# Patient Record
Sex: Female | Born: 1990 | Race: Black or African American | Hispanic: No | Marital: Single
Health system: Southern US, Community
[De-identification: ages and names within clinical notes are randomized; demographics above are authoritative.]

## PROBLEM LIST (undated history)

## (undated) DIAGNOSIS — S62009A Unspecified fracture of navicular [scaphoid] bone of unspecified wrist, initial encounter for closed fracture: Secondary | ICD-10-CM

## (undated) DIAGNOSIS — S43004A Unspecified dislocation of right shoulder joint, initial encounter: Secondary | ICD-10-CM

## (undated) DIAGNOSIS — N83201 Unspecified ovarian cyst, right side: Secondary | ICD-10-CM

## (undated) DIAGNOSIS — D649 Anemia, unspecified: Secondary | ICD-10-CM

## (undated) DIAGNOSIS — N83202 Unspecified ovarian cyst, left side: Secondary | ICD-10-CM

---
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
## 2012-06-01 ENCOUNTER — Encounter (HOSPITAL_COMMUNITY): Payer: Self-pay | Admitting: Physical Medicine and Rehabilitation

## 2012-06-01 ENCOUNTER — Emergency Department (HOSPITAL_COMMUNITY)
Admission: EM | Admit: 2012-06-01 | Discharge: 2012-06-01 | Disposition: A | Discharge status: Home / Self Care | Payer: Medicaid Other | Attending: Emergency Medicine | Admitting: Emergency Medicine

## 2012-06-01 DIAGNOSIS — W108XXA Fall (on) (from) other stairs and steps, initial encounter: Secondary | ICD-10-CM | POA: Insufficient documentation

## 2012-06-01 DIAGNOSIS — W19XXXA Unspecified fall, initial encounter: Secondary | ICD-10-CM

## 2012-06-01 DIAGNOSIS — S0003XA Contusion of scalp, initial encounter: Secondary | ICD-10-CM | POA: Insufficient documentation

## 2012-06-01 DIAGNOSIS — S1093XA Contusion of unspecified part of neck, initial encounter: Secondary | ICD-10-CM | POA: Insufficient documentation

## 2012-06-01 DIAGNOSIS — Y9229 Other specified public building as the place of occurrence of the external cause: Secondary | ICD-10-CM | POA: Insufficient documentation

## 2012-06-01 DIAGNOSIS — S01501A Unspecified open wound of lip, initial encounter: Secondary | ICD-10-CM | POA: Insufficient documentation

## 2012-06-01 DIAGNOSIS — S01511A Laceration without foreign body of lip, initial encounter: Secondary | ICD-10-CM

## 2012-06-01 DIAGNOSIS — F17200 Nicotine dependence, unspecified, uncomplicated: Secondary | ICD-10-CM | POA: Insufficient documentation

## 2012-06-01 DIAGNOSIS — Y9389 Activity, other specified: Secondary | ICD-10-CM | POA: Insufficient documentation

## 2012-06-01 DIAGNOSIS — S0083XA Contusion of other part of head, initial encounter: Secondary | ICD-10-CM

## 2012-06-01 MED ORDER — IBUPROFEN 600 MG PO TABS: 600.0000 mg | ORAL_TABLET | Freq: Four times a day (QID) | ORAL | Status: DC | PRN

## 2012-06-01 NOTE — ED Notes (Signed)
Pt presents to department for evaluation of fall. States she was at party last night when she fell down stairs accidentally. Pt states pain to nose and lips. Abrasion noted to top of lip, no bleeding noted. Pt also states pain to bridge of nose. 8/10 at the time. Pt is conscious alert and oriented x4. No signs of acute distress noted.

## 2012-06-01 NOTE — ED Notes (Signed)
Discharge instructions reviewed. Pt verbalized understanding.  

## 2012-06-01 NOTE — ED Provider Notes (Signed)
History     CSN: [id number]  Arrival date & time 06/01/12  1816   First MD Initiated Contact with Patient 06/01/12 1959      Chief Complaint  Patient presents with  . Fall    (Consider location/radiation/quality/duration/timing/severity/associated sxs/prior treatment) HPI Comments: Patient presents after a fall down several stairs approx 19 hrs ago. Patient c/o laceration to upper lip, swelling over bridge of nose. Also c/o mild R knee pain but is walking and moving without difficulty. No LOC, N/V, blurry vision, HA. No treatments PTA. Denies other injuries. Onset acute. Course constant. Nothing makes symptoms better or worse.   Patient is a 21 y.o. female presenting with fall. The history is provided by the patient.  Fall Pertinent negatives include no numbness, no nausea, no vomiting and no headaches.    No past medical history on file.  No past surgical history on file.  No family history on file.  History  Substance Use Topics  . Smoking status: Current Every Day Smoker    Types: Cigarettes  . Smokeless tobacco: Not on file  . Alcohol Use: No    OB History    Grav Para Term Preterm Abortions TAB SAB Ect Mult Living                  Review of Systems  Constitutional: Negative for fatigue.  HENT: Negative for neck pain and tinnitus.   Eyes: Negative for photophobia, pain and visual disturbance.  Respiratory: Negative for shortness of breath.   Cardiovascular: Negative for chest pain.  Gastrointestinal: Negative for nausea and vomiting.  Musculoskeletal: Negative for back pain and gait problem.  Skin: Positive for wound.  Neurological: Negative for dizziness, weakness, light-headedness, numbness and headaches.  Psychiatric/Behavioral: Negative for confusion and decreased concentration.    Allergies  Review of patient's allergies indicates no known allergies.  Home Medications   Current Outpatient Rx  Name Route Sig Dispense Refill  . ACETAMINOPHEN 500  MG PO TABS Oral Take 1,000 mg by mouth every 6 (six) hours as needed. For pain    . IBUPROFEN 200 MG PO TABS Oral Take 400 mg by mouth every 6 (six) hours as needed. For pain      BP 111/68  Pulse 83  Temp 98.7 F (37.1 C) (Oral)  Resp 18  SpO2 100%  Physical Exam  Nursing note and vitals reviewed. Constitutional: She is oriented to person, place, and time. She appears well-developed and well-nourished.  HENT:  Head: Normocephalic. Head is without raccoon's eyes and without Battle's sign.  Right Ear: Tympanic membrane, external ear and ear canal normal. No hemotympanum.  Left Ear: Tympanic membrane, external ear and ear canal normal. No hemotympanum.  Nose: Nose normal. No nasal septal hematoma.  Mouth/Throat: Uvula is midline, oropharynx is clear and moist and mucous membranes are normal.    Eyes: Conjunctivae normal, EOM and lids are normal. Pupils are equal, round, and reactive to light. Right eye exhibits no nystagmus. Left eye exhibits no nystagmus.       No visible hyphema noted  Neck: Normal range of motion. Neck supple.  Cardiovascular: Normal rate and regular rhythm.   Pulmonary/Chest: Effort normal and breath sounds normal.  Abdominal: Soft. There is no tenderness.  Musculoskeletal:       Cervical back: She exhibits normal range of motion, no tenderness and no bony tenderness.       Thoracic back: She exhibits no tenderness and no bony tenderness.  Lumbar back: She exhibits no tenderness and no bony tenderness.  Neurological: She is alert and oriented to person, place, and time. She has normal strength and normal reflexes. No cranial nerve deficit or sensory deficit. Coordination normal. GCS eye subscore is 4. GCS verbal subscore is 5. GCS motor subscore is 6.  Skin: Skin is warm and dry.  Psychiatric: She has a normal mood and affect.    ED Course  Procedures (including critical care time)  Labs Reviewed - No data to display No results found.   1. Fall     2. Lip laceration   3. Facial contusion       8:08 PM Patient seen and examined. Patient counseled on wound care.    Vital signs reviewed and are as follows: Filed Vitals:   06/01/12 1840  BP: 111/68  Pulse: 83  Temp: 98.7 F (37.1 C)  Resp: 18   Patient was counseled on head injury precautions and symptoms that should indicate their return to the ED.  These include severe worsening headache, vision changes, confusion, loss of consciousness, trouble walking, nausea & vomiting, or weakness/tingling in extremities.     MDM  Pt 19-20hrs s/p head injury. Contusion on bridge of nose. No point tenderness of face. Normal jaw occlusion. Full ROM jaw. Lac is healing, non-gaping, no indication for closure. No other injuries. Patient appears well.         Renne Crigler, [place] 06/03/12 1649

## 2012-06-04 NOTE — ED Provider Notes (Signed)
Medical screening examination/treatment/procedure(s) were performed by non-physician practitioner and as supervising physician I was immediately available for consultation/collaboration.  Jaryan Chicoine K Linker, MD 06/04/12 1203 

## 2013-12-21 ENCOUNTER — Encounter (HOSPITAL_COMMUNITY): Payer: Self-pay | Admitting: Emergency Medicine

## 2013-12-21 ENCOUNTER — Emergency Department (HOSPITAL_COMMUNITY)
Admission: EM | Admit: 2013-12-21 | Discharge: 2013-12-21 | Discharge status: Against Medical Advice | Payer: Medicaid Other | Attending: Emergency Medicine | Admitting: Emergency Medicine

## 2013-12-21 ENCOUNTER — Emergency Department (HOSPITAL_COMMUNITY): Payer: Medicaid Other

## 2013-12-21 ENCOUNTER — Emergency Department (INDEPENDENT_AMBULATORY_CARE_PROVIDER_SITE_OTHER)
Admission: EM | Admit: 2013-12-21 | Discharge: 2013-12-21 | Disposition: A | Discharge status: Home / Self Care | Payer: Medicaid Other | Source: Home / Self Care | Attending: Family Medicine | Admitting: Family Medicine

## 2013-12-21 DIAGNOSIS — F17200 Nicotine dependence, unspecified, uncomplicated: Secondary | ICD-10-CM | POA: Insufficient documentation

## 2013-12-21 DIAGNOSIS — Y9351 Activity, roller skating (inline) and skateboarding: Secondary | ICD-10-CM | POA: Insufficient documentation

## 2013-12-21 DIAGNOSIS — Y92838 Other recreation area as the place of occurrence of the external cause: Secondary | ICD-10-CM

## 2013-12-21 DIAGNOSIS — S62009A Unspecified fracture of navicular [scaphoid] bone of unspecified wrist, initial encounter for closed fracture: Secondary | ICD-10-CM

## 2013-12-21 DIAGNOSIS — S6990XA Unspecified injury of unspecified wrist, hand and finger(s), initial encounter: Secondary | ICD-10-CM | POA: Insufficient documentation

## 2013-12-21 DIAGNOSIS — S59919A Unspecified injury of unspecified forearm, initial encounter: Principal | ICD-10-CM

## 2013-12-21 DIAGNOSIS — Y9239 Other specified sports and athletic area as the place of occurrence of the external cause: Secondary | ICD-10-CM | POA: Insufficient documentation

## 2013-12-21 DIAGNOSIS — S59909A Unspecified injury of unspecified elbow, initial encounter: Secondary | ICD-10-CM | POA: Insufficient documentation

## 2013-12-21 MED ORDER — IBUPROFEN 800 MG PO TABS
800.0000 mg | ORAL_TABLET | Freq: Once | ORAL | Status: AC
  Administered 2013-12-21: 800 mg via ORAL

## 2013-12-21 MED ORDER — IBUPROFEN 800 MG PO TABS
ORAL_TABLET | ORAL | Status: AC
  Filled 2013-12-21: qty 1

## 2013-12-21 MED ORDER — HYDROCODONE-ACETAMINOPHEN 5-325 MG PO TABS
ORAL_TABLET | ORAL | Status: AC
  Filled 2013-12-21: qty 1

## 2013-12-21 MED ORDER — HYDROCODONE-ACETAMINOPHEN 5-325 MG PO TABS
1.0000 | ORAL_TABLET | Freq: Once | ORAL | Status: AC
  Administered 2013-12-21: 1 via ORAL

## 2013-12-21 NOTE — ED Provider Notes (Signed)
Whitney Carter is a 23 y.o. female who presents to Urgent Care today for wrist pain. Patient fell skateboarding this morning at about 3:00. She landed on her outstretched right wrist. She notes wrist and forearm pain. She initially presented she was a long emergency department but left after an elbow x-ray was obtained.  She took some Tylenol at about 4:00 in the morning. The pain is severe and worse with activity. She denies any radiating pain weakness or numbness.   History reviewed. No pertinent past medical history. History  Substance Use Topics  . Smoking status: Current Every Day Smoker    Types: Cigarettes  . Smokeless tobacco: Not on file  . Alcohol Use: No   ROS as above Medications: No current facility-administered medications for this encounter.   No current outpatient prescriptions on file.    Exam:  BP 120/81  Pulse 85  Temp(Src) 98.3 F (36.8 C) (Oral)  Resp 16  SpO2 100%  LMP 12/05/2013 Gen: Well NAD Right arm: Swelling over the distal radius. Tender palpation mid shaft radius all metacarpals and distal radius. Tender palpation anatomical snuffbox as well.  Elbow and shoulder motion are intact and nontender.  Pulses capillary refill sensation are intact distally Finger motion is intact.  No results found for this or any previous visit (from the past 24 hour(s)). Dg Elbow Complete Right  12/21/2013   CLINICAL DATA:  Right elbow pain following a fall.  EXAM: RIGHT ELBOW - COMPLETE 3+ VIEW  COMPARISON:  None.  FINDINGS: There is no evidence of fracture, dislocation, or joint effusion. There is no evidence of arthropathy or other focal bone abnormality. Soft tissues are unremarkable.  IMPRESSION: Normal examination.   Electronically Signed   By: Gordan PaymentSteve  Reid M.D.   On: 12/21/2013 05:39   Dg Forearm Left  12/21/2013   CLINICAL DATA:  Pain in the forearm following skateboard accident.  EXAM: LEFT FOREARM - 2 VIEW  COMPARISON:  None.  FINDINGS: There is no evidence of  fracture or other focal bone lesions. Soft tissues are unremarkable.  IMPRESSION: Negative.   Electronically Signed   By: Sherian ReinWei-Chen  Lin M.D.   On: 12/21/2013 17:00   Dg Wrist Complete Left  12/21/2013   CLINICAL DATA:  Skateboard injury.  Fall.  EXAM: LEFT WRIST - COMPLETE 3+ VIEW  COMPARISON:  None.  FINDINGS: Transverse acute fracture of the midportion of the scaphoid. No other fracture identified. Regional soft tissue swelling noted.  IMPRESSION: 1. Transverse acute fracture of the midportion of the scaphoid.   Electronically Signed   By: Herbie BaltimoreWalt  Liebkemann M.D.   On: 12/21/2013 16:59    Assessment and Plan: 23 y.o. female with right scaphoid fracture.  Patient was placed into a thumb spica splint.  Followup with hand surgery in a few days. NSAIDs for pain control.    Discussed warning signs or symptoms. Please see discharge instructions. Patient expresses understanding.  **Note** the order was originally placed for the left wrist. The read says left wrist and left forearm however the injury is on the right side  Rodolph BongEvan S Vincient Vanaman, MD 12/21/13 [phone]

## 2013-12-21 NOTE — ED Notes (Signed)
Pt reports that she was riding on a skateboard at 0400 this morning and was going down a hill fast and fell hurting her right arm. Pt a&o x4, skin warm and dry, ambulatory to triage.

## 2013-12-21 NOTE — Discharge Instructions (Signed)
Thank you for coming in today. Take up to 2 aleve twice daily for pain as needed.  Follow up with Dr. Merrilee SeashoreKuzma's office this week.   Scaphoid Fracture, Wrist A fracture is a break in the bone. The bone you have broken often does not show up as a fracture on x-ray until later on in the healing phase. This bone is called the scaphoid bone. With this bone, your caregiver will often cast or splint your wrist as though it is fractured, even if a fracture is not seen on the x-ray. This is often done with wrist injuries in which there is tenderness at the base of the thumb. An x-ray at 1-3 weeks after your injury may confirm this fracture. A cast or splint is used to protect and keep your injured bone in good position for healing. The cast or splint will be on generally for about 6 to 16 weeks, depending on your health, age, the fracture location and how quickly you heal. Another name for the scaphoid bone is the navicular bone. HOME CARE INSTRUCTIONS   To lessen the swelling and pain, keep the injured part elevated above your heart while sitting or lying down.  Apply ice to the injury for 15-20 minutes, 03-04 times per day while awake, for 2 days. Put the ice in a plastic bag and place a thin towel between the bag of ice and your cast.  If you have a plaster or fiberglass cast or splint:  Do not try to scratch the skin under the cast using sharp or pointed objects.  Check the skin around the cast every day. You may put lotion on any red or sore areas.  Keep your cast or splint dry and clean.  If you have a plaster splint:  Wear the splint as directed.  You may loosen the elastic bandage around the splint if your fingers become numb, tingle, or turn cold or blue.  If you have been put in a removable splint, wear and use as directed.  Do not put pressure on any part of your cast or splint; it may deform or break. Rest your cast or splint only on a pillow the first 24 hours until it is fully  hardened.  Your cast or splint can be protected during bathing with a plastic bag. Do not lower the cast or splint into water.  Only take over-the-counter or prescription medicines for pain, discomfort, or fever as directed by your caregiver.  If your caregiver has given you a follow up appointment, it is very important to keep that appointment. Not keeping the appointment could result in chronic pain and decreased function. If there is any problem keeping the appointment, you must call back to this facility for assistance. SEEK IMMEDIATE MEDICAL CARE IF:   Your cast gets damaged, wet or breaks.  You have continued severe pain or more swelling than you did before the cast or splint was put on.  Your skin or nails below the injury turn blue or gray, or feel cold or numb.  You have tingling or burning pain in your fingers or increasing pain with movement of your fingers Document Released: 07/14/2002 Document Revised: 10/16/2011 Document Reviewed: 03/12/2009 [facility] Patient Information 2014 [employer], [place].

## 2013-12-21 NOTE — Progress Notes (Signed)
Orthopedic Tech Progress Note Patient Details:  Whitney AngerJazmine S Carter Oct 25, 1990 [id number]  Ortho Devices Type of Ortho Device: Ace wrap;Thumb spica splint Splint Material: Fiberglass Ortho Device/Splint Location: RUE Ortho Device/Splint Interventions: Ordered;Application   Jennye MoccasinAnthony Craig Emersynn Deatley 12/21/2013, 5:17 PM

## 2013-12-21 NOTE — ED Notes (Signed)
Patient complains of right wrist pain after falling off of her skate board last night around 3 am.

## 2013-12-21 NOTE — ED Notes (Signed)
Pt was not in room with PA went to assess pt

## 2013-12-22 ENCOUNTER — Ambulatory Visit (HOSPITAL_COMMUNITY)
Admission: RE | Admit: 2013-12-22 | Discharge: 2013-12-22 | Disposition: A | Discharge status: Home / Self Care | Payer: Medicaid Other | Source: Ambulatory Visit | Attending: Family Medicine | Admitting: Family Medicine

## 2013-12-22 ENCOUNTER — Other Ambulatory Visit: Payer: Self-pay | Admitting: Family Medicine

## 2013-12-22 DIAGNOSIS — S62009A Unspecified fracture of navicular [scaphoid] bone of unspecified wrist, initial encounter for closed fracture: Secondary | ICD-10-CM | POA: Insufficient documentation

## 2013-12-22 DIAGNOSIS — R52 Pain, unspecified: Secondary | ICD-10-CM

## 2013-12-22 DIAGNOSIS — W19XXXA Unspecified fall, initial encounter: Secondary | ICD-10-CM | POA: Insufficient documentation

## 2014-01-22 ENCOUNTER — Encounter (HOSPITAL_COMMUNITY): Payer: Self-pay | Admitting: Emergency Medicine

## 2014-01-22 ENCOUNTER — Emergency Department (HOSPITAL_COMMUNITY): Payer: Medicaid Other

## 2014-01-22 ENCOUNTER — Emergency Department (HOSPITAL_COMMUNITY)
Admission: EM | Admit: 2014-01-22 | Discharge: 2014-01-23 | Disposition: A | Discharge status: Home / Self Care | Payer: Medicaid Other | Attending: Emergency Medicine | Admitting: Emergency Medicine

## 2014-01-22 DIAGNOSIS — S43016A Anterior dislocation of unspecified humerus, initial encounter: Secondary | ICD-10-CM | POA: Insufficient documentation

## 2014-01-22 DIAGNOSIS — Y929 Unspecified place or not applicable: Secondary | ICD-10-CM | POA: Insufficient documentation

## 2014-01-22 DIAGNOSIS — X500XXA Overexertion from strenuous movement or load, initial encounter: Secondary | ICD-10-CM | POA: Insufficient documentation

## 2014-01-22 DIAGNOSIS — S4291XA Fracture of right shoulder girdle, part unspecified, initial encounter for closed fracture: Secondary | ICD-10-CM

## 2014-01-22 DIAGNOSIS — Y9389 Activity, other specified: Secondary | ICD-10-CM | POA: Insufficient documentation

## 2014-01-22 DIAGNOSIS — F17200 Nicotine dependence, unspecified, uncomplicated: Secondary | ICD-10-CM | POA: Insufficient documentation

## 2014-01-22 MED ORDER — FENTANYL CITRATE 0.05 MG/ML IJ SOLN
INTRAMUSCULAR | Status: AC
  Filled 2014-01-22: qty 2

## 2014-01-22 MED ORDER — PROPOFOL 10 MG/ML IV BOLUS
0.5000 mg/kg | Freq: Once | INTRAVENOUS | Status: AC
  Administered 2014-01-22: 29.1 mg via INTRAVENOUS
  Filled 2014-01-22: qty 20

## 2014-01-22 MED ORDER — FENTANYL CITRATE 0.05 MG/ML IJ SOLN
100.0000 ug | Freq: Once | INTRAMUSCULAR | Status: AC
  Administered 2014-01-22: 100 ug via INTRAVENOUS
  Filled 2014-01-22: qty 2

## 2014-01-22 MED ORDER — FENTANYL CITRATE 0.05 MG/ML IJ SOLN
50.0000 ug | INTRAMUSCULAR | Status: AC
  Administered 2014-01-22: 50 ug via INTRAVENOUS

## 2014-01-22 NOTE — Sedation Documentation (Signed)
X-ray at bedside

## 2014-01-22 NOTE — ED Notes (Signed)
Pt transported to xray via stretcher

## 2014-01-22 NOTE — ED Notes (Signed)
The pt is c/o rt shoulder pain since she swung it upward and the pain  Was severe.  There is deformity to the rt shoulder.  Good distal pulses

## 2014-01-22 NOTE — Sedation Documentation (Signed)
Medication dose calculated and verified by dr. Bebe ShaggyWickline

## 2014-01-22 NOTE — Sedation Documentation (Signed)
Closed reduction by dr. Bebe Shaggywickline completed, xrays ordered.

## 2014-01-22 NOTE — ED Provider Notes (Signed)
CSN: [id number]     Arrival date & time 01/22/14  2123 History   First MD Initiated Contact with Patient 01/22/14 2227     Chief Complaint  Patient presents with  . deformity rt shoulder       Patient is a 23 y.o. female presenting with shoulder pain. The history is provided by the patient.  Shoulder Pain This is a new problem. The current episode started 1 to 2 hours ago. The problem occurs constantly. The problem has been gradually worsening. Pertinent negatives include no chest pain, no abdominal pain, no headaches and no shortness of breath. Exacerbated by: movement. The symptoms are relieved by rest. She has tried rest for the symptoms. The treatment provided no relief.  pt reports she swung her right arm upwards and felt shoulder pain No falls/trauma reported She denies weakness/numbness to right UE She has no other complaints She had recent injury to right wrist and is currently in a cast   PMH - none  History reviewed. No pertinent past surgical history. No family history on file. History  Substance Use Topics  . Smoking status: Current Every Day Smoker    Types: Cigarettes  . Smokeless tobacco: Not on file  . Alcohol Use: No   OB History   Grav Para Term Preterm Abortions TAB SAB Ect Mult Living                 Review of Systems  Respiratory: Negative for shortness of breath.   Cardiovascular: Negative for chest pain.  Gastrointestinal: Negative for abdominal pain.  Neurological: Negative for headaches.  All other systems reviewed and are negative.     Allergies  Review of patient's allergies indicates no known allergies.  Home Medications   Prior to Admission medications   Not on File   BP 104/57  Pulse 82  Temp(Src) 98.5 F (36.9 C)  Resp 20  Ht 5\' 8"  (1.727 m)  Wt 128 lb (58.06 kg)  BMI 19.47 kg/m2  SpO2 100%  LMP 01/05/2014 Physical Exam CONSTITUTIONAL: Well developed/well nourished HEAD: Normocephalic/atraumatic EYES: EOMI/PERRL ENMT:  Mucous membranes moist NECK: supple no meningeal signs SPINE:entire spine nontender CV: S1/S2 noted, no murmurs/rubs/gallops noted LUNGS: Lungs are clear to auscultation bilaterally, no apparent distress ABDOMEN: soft, nontender, no rebound or guarding NEURO: Pt is awake/alert, moves all extremitiesx4, full movement of fingers on right UE.  Denies numbness to right fingers EXTREMITIES: right shoulder deformity noted Right wrist in cast.  She is able to move fingers without difficulty.  Distal cap refill less than 2 seconds No tenderness to right elbow, full ROM noted SKIN: warm, color normal PSYCH: no abnormalities of mood noted  ED Course  Reduction of dislocation Date/Time: 01/22/2014 11:40 PM Performed by: Joya GaskinsWICKLINE, DONALD W Authorized by: Joya GaskinsWICKLINE, DONALD W Consent: written consent obtained. Risks and benefits: risks, benefits and alternatives were discussed Consent given by: patient Patient identity confirmed: verbally with patient and arm band Time out: Immediately prior to procedure a "time out" was called to verify the correct patient, procedure, equipment, support staff and site/side marked as required. Patient sedated: yes Patient tolerance: Patient tolerated the procedure well with no immediate complications. Comments: Closed right shoulder reduction performed Pt tolerated well She is neurovascularly intact after procedure      Procedural sedation Performed by: Joya GaskinsWICKLINE,DONALD W Consent: Verbal consent obtained. Risks and benefits: risks, benefits and alternatives were discussed Required items: required devices, and special equipment available Patient identity confirmed: arm band and provided demographic data  Time out: Immediately prior to procedure a "time out" was called to verify the correct patient, procedure, equipment, support staff and site/side marked as required. Sedation type: moderate (conscious) sedation NPO time confirmed and considered Sedatives:  PROPOFOL Physician Time at Bedside: 15 Vitals: Vital signs were monitored during sedation. Cardiac Monitor, pulse oximeter Patient tolerance: Patient tolerated the procedure well with no immediate complications. Comments: Pt with uneventful recovery. Returned to pre-procedural sedation baseline     SPLINT APPLICATION Date/Time: 12:36 AM Authorized by: Joya GaskinsWICKLINE,DONALD W Consent: Verbal consent obtained. Risks and benefits: risks, benefits and alternatives were discussed Consent given by: patient Splint applied by: orthopedic technician Location details: right Upper extremity Splint type: sling Supplies used: sling Post-procedure: The splinted body part was neurovascularly unchanged following the procedure. Patient tolerance: Patient tolerated the procedure well with no immediate complications.     Imaging Review Dg Shoulder Right  01/22/2014   CLINICAL DATA:  Shoulder pain.  EXAM: RIGHT SHOULDER - 2+ VIEW  COMPARISON:  None.  FINDINGS: Anterior dislocation of the right shoulder. No large bony Bankart fragment identified.  IMPRESSION: Anterior dislocation of the right shoulder.   Electronically Signed   By: Andreas NewportGeoffrey  Lamke M.D.   On: 01/22/2014 22:16   Dg Shoulder Right Port  01/23/2014   CLINICAL DATA:  Status post reduction of right shoulder dislocation.  EXAM: PORTABLE RIGHT SHOULDER - 2+ VIEW  COMPARISON:  Right shoulder radiographs performed earlier today at 09:58 p.m.  FINDINGS: There has been successful reduction of the patient's right humeral head dislocation. The right humeral head is seated normally at the glenoid fossa. No definite Hill-Sachs or osseous Bankart lesion is identified.  The acromioclavicular joint is unremarkable in appearance. No significant soft tissue abnormalities are seen. The visualized portions of the lungs are clear.  IMPRESSION: Successful reduction of right humeral head dislocation. No definite Hill-Sachs or osseous Bankart lesion seen.   Electronically  Signed   By: Roanna RaiderJeffery  Chang M.D.   On: 01/23/2014 00:31   12:36 AM  Pt back to baseline, feels well for d/c home    MDM   Final diagnoses:  Traumatic closed displaced fracture of right shoulder with anterior dislocation    Nursing notes including past medical history and social history reviewed and considered in documentation xrays reviewed and considered     Joya Gaskinsonald W Wickline, MD 01/23/14 [phone]

## 2014-01-22 NOTE — ED Notes (Signed)
See ED notes  Joya Gaskinsonald W Wickline, MD 01/22/14 2310

## 2014-01-23 MED ORDER — OXYCODONE-ACETAMINOPHEN 5-325 MG PO TABS: 1.0000 | ORAL_TABLET | ORAL | Status: DC | PRN

## 2014-01-23 NOTE — ED Notes (Signed)
Pt given juice

## 2014-01-23 NOTE — Progress Notes (Signed)
Orthopedic Tech Progress Note Patient Details:  Whitney Carter 1991/02/12 [id number]  Ortho Devices Type of Ortho Device: Sling immobilizer Ortho Device/Splint Location: right upper extremity is immobilized. patient understands and agrees. she will contact nursing staff with any questions or concerns.  Ortho Device/Splint Interventions: Application   Early CharsBaker,William Anthony 01/23/2014, 12:22 AM

## 2014-01-29 ENCOUNTER — Emergency Department (HOSPITAL_COMMUNITY)
Admission: EM | Admit: 2014-01-29 | Discharge: 2014-01-29 | Disposition: A | Discharge status: Home / Self Care | Payer: Medicaid Other | Attending: Emergency Medicine | Admitting: Emergency Medicine

## 2014-01-29 ENCOUNTER — Encounter (HOSPITAL_COMMUNITY): Payer: Self-pay | Admitting: Emergency Medicine

## 2014-01-29 ENCOUNTER — Emergency Department (HOSPITAL_COMMUNITY): Payer: Medicaid Other

## 2014-01-29 DIAGNOSIS — S43016A Anterior dislocation of unspecified humerus, initial encounter: Secondary | ICD-10-CM | POA: Insufficient documentation

## 2014-01-29 DIAGNOSIS — Y929 Unspecified place or not applicable: Secondary | ICD-10-CM | POA: Insufficient documentation

## 2014-01-29 DIAGNOSIS — Z8781 Personal history of (healed) traumatic fracture: Secondary | ICD-10-CM | POA: Insufficient documentation

## 2014-01-29 DIAGNOSIS — X500XXA Overexertion from strenuous movement or load, initial encounter: Secondary | ICD-10-CM | POA: Insufficient documentation

## 2014-01-29 DIAGNOSIS — Y9389 Activity, other specified: Secondary | ICD-10-CM | POA: Insufficient documentation

## 2014-01-29 DIAGNOSIS — S43004A Unspecified dislocation of right shoulder joint, initial encounter: Secondary | ICD-10-CM

## 2014-01-29 DIAGNOSIS — Z9889 Other specified postprocedural states: Secondary | ICD-10-CM | POA: Insufficient documentation

## 2014-01-29 HISTORY — DX: Unspecified fracture of navicular (scaphoid) bone of unspecified wrist, initial encounter for closed fracture: S62.009A

## 2014-01-29 HISTORY — DX: Unspecified dislocation of right shoulder joint, initial encounter: S43.004A

## 2014-01-29 MED ORDER — PROPOFOL 10 MG/ML IV BOLUS
0.5000 mg/kg | Freq: Once | INTRAVENOUS | Status: DC
  Filled 2014-01-29: qty 20

## 2014-01-29 MED ORDER — PROPOFOL 10 MG/ML IV BOLUS
INTRAVENOUS | Status: DC | PRN
  Administered 2014-01-29 (×2): 30 mg via INTRAVENOUS

## 2014-01-29 NOTE — ED Notes (Signed)
Patient transported to X-ray 

## 2014-01-29 NOTE — Discharge Instructions (Signed)
Shoulder Dislocation °Your shoulder is made up of three bones: the collar bone (clavicle); the shoulder blade (scapula), which includes the socket (glenoid cavity); and the upper arm bone (humerus). Your shoulder joint is the place where these bones meet. Strong, fibrous tissues hold these bones together (ligaments). Muscles and strong, fibrous tissues that connect the muscles to these bones (tendons) allow your arm to move through this joint. The range of motion of your shoulder joint is more extensive than most of your other joints, and the glenoid cavity is very shallow. That is the reason that your shoulder joint is one of the most unstable joints in your body. It is far more prone to dislocation than your other joints. Shoulder dislocation is when your humerus is forced out of your shoulder joint. °CAUSES °Shoulder dislocation is caused by a forceful impact on your shoulder. This impact usually is from an injury, such as a sports injury or a fall. °SYMPTOMS °Symptoms of shoulder dislocation include: °· Deformity of your shoulder. °· Intense pain. °· Inability to move your shoulder joint. °· Numbness, weakness, or tingling around your shoulder joint (your neck or down your arm). °· Bruising or swelling around your shoulder. °DIAGNOSIS °In order to diagnose a dislocated shoulder, your caregiver will perform a physical exam. Your caregiver also may have an X-ray exam done to see if you have any broken bones. Magnetic resonance imaging (MRI) is a procedure that sometimes is done to help your caregiver see any damage to the soft tissues around your shoulder, particularly your rotator cuff tendons. Additionally, your caregiver also may have electromyography done to measure the electrical discharges produced in your muscles if you have signs or symptoms of nerve damage. °TREATMENT °A shoulder dislocation is treated by placing the humerus back in the joint (reduction). Your caregiver does this either manually (closed  reduction), by moving your humerus back into the joint through manipulation, or through surgery (open reduction). When your humerus is back in place, severe pain should improve almost immediately. °You also may need to have surgery if you have a weak shoulder joint or ligaments, and you have recurring shoulder dislocations, despite rehabilitation. In rare cases, surgery is necessary if your nerves or blood vessels are damaged during the dislocation. °After your reduction, your arm will be placed in a shoulder immobilizer or sling to keep it from moving. Your caregiver will have you wear your shoulder immoblizer or sling for 3 days to 3 weeks, depending on how serious your dislocation is. When your shoulder immobilizer or sling is removed, your caregiver may prescribe physical therapy to help improve the range of motion in your shoulder joint. °HOME CARE INSTRUCTIONS  °The following measures can help to reduce pain and speed up the healing process: °· Rest your injured joint. Do not move it. Avoid activities similar to the one that caused your injury. °· Apply ice to your injured joint for the first day or two after your reduction or as directed by your caregiver. Applying ice helps to reduce inflammation and pain. °¨ Put ice in a plastic bag. °¨ Place a towel between your skin and the bag. °¨ Leave the ice on for 15-20 minutes at a time, every 2 hours while you are awake. °· Exercise your hand by squeezing a soft ball. This helps to eliminate stiffness and swelling in your hand and wrist. °· Take over-the-counter or prescription medicine for pain or discomfort as told by your caregiver. °SEEK IMMEDIATE MEDICAL CARE IF:  °· Your   shoulder immobilizer or sling becomes damaged. °· Your pain becomes worse rather than better. °· You lose feeling in your arm or hand, or they become white and cold. °MAKE SURE YOU:  °· Understand these instructions. °· Will watch your condition. °· Will get help right away if you are not  doing well or get worse. °Document Released: 04/18/2001 Document Revised: 10/16/2011 Document Reviewed: 05/14/2011 °ExitCare® Patient Information ©2015 [employer]. This information is not intended to replace advice given to you by your health care provider. Make sure you discuss any questions you have with your health care provider. ° °

## 2014-01-29 NOTE — ED Provider Notes (Signed)
CSN: [id number]     Arrival date & time 01/29/14  1520 History   First MD Initiated Contact with Patient 01/29/14 1526     Chief Complaint  Patient presents with  . Shoulder Injury     (Consider location/radiation/quality/duration/timing/severity/associated sxs/prior Treatment) Patient is a 23 y.o. female presenting with shoulder injury. The history is provided by the patient.  Shoulder Injury This is a recurrent problem. The current episode started less than 1 hour ago. Pertinent negatives include no chest pain and no shortness of breath.   patient has a right shoulder dislocation. She had her first dislocation around a week ago. He was reduced in the ER. He states today she is put on some closing of the arm and felt it pop out. No other injury. No numbness weakness. She states it feels like her previous dislocation. Her last meal was around 4 hours ago. She has followup with orthopedic surgeons tomorrow.  Past Medical History  Diagnosis Date  . Dislocation of right shoulder joint   . Scaphoid fracture, wrist, closed     right   History reviewed. No pertinent past surgical history. History reviewed. No pertinent family history. History  Substance Use Topics  . Smoking status: Never Smoker   . Smokeless tobacco: Not on file  . Alcohol Use: No   OB History   Grav Para Term Preterm Abortions TAB SAB Ect Mult Living                 Review of Systems  Constitutional: Negative for fever.  Respiratory: Negative for shortness of breath.   Cardiovascular: Negative for chest pain.  Musculoskeletal: Negative for back pain, neck pain and neck stiffness.  Neurological: Negative for weakness and numbness.      Allergies  Review of patient's allergies indicates no known allergies.  Home Medications   Prior to Admission medications   Medication Sig Start Date End Date Taking? Authorizing Provider  oxyCODONE-acetaminophen (PERCOCET/ROXICET) 5-325 MG per tablet Take 1 tablet by  mouth every 4 (four) hours as needed for severe pain. 01/23/14  Yes Joya Gaskinsonald W Wickline, MD   BP 105/63  Pulse 75  Temp(Src) 98.2 F (36.8 C)  Resp 20  Wt 128 lb 1.4 oz (58.1 kg)  SpO2 100%  LMP 01/05/2014 Physical Exam  Constitutional: She is oriented to person, place, and time. She appears well-developed.  Cardiovascular: Normal rate and regular rhythm.   Pulmonary/Chest: Effort normal.  Abdominal: Soft. Bowel sounds are normal.  Musculoskeletal:  Squaring of right shoulder with anterior fullness. Decreased range of motion. Sensation intact over axillary distribution. Sensation intact grossly of her hand, however patient is in a forearm cast. Good capillary refill of her fingers  Neurological: She is alert and oriented to person, place, and time.  Skin: Skin is warm. No erythema.    ED Course  ORTHOPEDIC INJURY TREATMENT Date/Time: 01/09/2014 8:03 AM Performed by: Benjiman CorePICKERING, Priya Matsen R. Authorized by: Billee CashingPICKERING, Bonita Brindisi R. Consent: Verbal consent obtained. written consent obtained. Risks and benefits: risks, benefits and alternatives were discussed Consent given by: patient Required items: required blood products, implants, devices, and special equipment available Patient identity confirmed: verbally with patient Injury location: shoulder Location details: right shoulder Injury type: dislocation Dislocation type: anterior Hill-Sachs deformity: no Chronicity: recurrent Pre-procedure neurovascular assessment: neurovascularly intact Pre-procedure distal perfusion: normal Pre-procedure neurological function: normal Pre-procedure range of motion: reduced Local anesthesia used: no Patient sedated: yes Sedatives: propofol Analgesia: fentanyl Vitals: Vital signs were monitored during sedation. (10 minutes of sedation)  Manipulation performed: yes Reduction method: external rotation Reduction successful: yes X-ray confirmed reduction: yes Immobilization: sling Post-procedure  neurovascular assessment: post-procedure neurovascularly intact Post-procedure distal perfusion: normal Post-procedure neurological function: normal Post-procedure range of motion: improved Patient tolerance: Patient tolerated the procedure well with no immediate complications.   (including critical care time) Labs Review Labs Reviewed - No data to display  Imaging Review Dg Shoulder Right  01/29/2014   CLINICAL DATA:  Status post reduction of right shoulder  EXAM: RIGHT SHOULDER - 2+ VIEW  COMPARISON:  01/22/2014  FINDINGS: Interval reduction of right shoulder. There is no evidence of fracture or dislocation. There is no evidence of arthropathy or other focal bone abnormality. Soft tissues are unremarkable.  IMPRESSION: Normal exam.   Electronically Signed   By: Signa Kellaylor  Stroud M.D.   On: 01/29/2014 17:23     EKG Interpretation None      MDM   Final diagnoses:  Shoulder dislocation, right, initial encounter    Patient with recurrent shoulder dislocation. Reduced under sedation. Will followup with Ortho as planned.      Juliet RudeNathan R. Rubin PayorPickering, MD 01/31/14 [phone]

## 2014-01-29 NOTE — ED Notes (Signed)
Pt is in pain.  

## 2014-01-29 NOTE — ED Notes (Signed)
Pt from home via [facility] with c/o shoulder dislocation.  Pt states she was changing clothes and it popped out.  Shoulder was dislocated exactly a week ago and her s/p appointment with ortho surgery is scheduled for tomorrow.  Pt was given 200 mcg fentanyl, pain still 10/10 but she "doesn't care now."  Pt in NAD, A&O.

## 2014-01-29 NOTE — Sedation Documentation (Addendum)
Shoulder appears to be reduced. X-ray to confirm.

## 2014-03-04 ENCOUNTER — Ambulatory Visit (INDEPENDENT_AMBULATORY_CARE_PROVIDER_SITE_OTHER): Payer: Medicaid Other | Admitting: Family Medicine

## 2014-03-04 ENCOUNTER — Encounter: Payer: Self-pay | Admitting: Family Medicine

## 2014-03-04 ENCOUNTER — Other Ambulatory Visit: Payer: Self-pay | Admitting: *Deleted

## 2014-03-04 VITALS — BP 102/60 | HR 60 | Temp 98.2°F | Ht 68.5 in | Wt 128.0 lb

## 2014-03-04 DIAGNOSIS — N946 Dysmenorrhea, unspecified: Secondary | ICD-10-CM

## 2014-03-04 NOTE — Assessment & Plan Note (Signed)
Currently considering nexplanon vs IUD (skyla) Intending to use for bad cramps Only has sexual relations with women  No risk for pregnancy or STD at this time (monogamous)

## 2014-03-04 NOTE — Progress Notes (Signed)
Patient ID: Whitney Carter, female   DOB: 1990/10/03, 23 y.o.   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  Reviewed problem list.  Medications- reviewed and updated Chief complaint-noted No additions to family history Social history- patient is a never smoker  Objective: BP 102/60  Pulse 60  Temp(Src) 98.2 F (36.8 C) (Oral)  Ht 5' 8.5" (1.74 m)  Wt 128 lb (58.06 kg)  BMI 19.18 kg/m2  LMP 02/04/2014 Gen: NAD, alert, cooperative with exam HEENT: NCAT, EOMI, PERRL, TMs nml Neck: FROM, supple CV: RRR, good S1/S2, no murmur, cap refill <3 Resp: CTABL, no wheezes, non-labored Abd: SNTND, BS present, no guarding or organomegaly Ext: No edema, warm, normal tone, moves UE/LE spontaneously Neuro: Alert and oriented, No gross deficits Skin: no rashes no lesions  Assessment/Plan: See problem based a/p

## 2014-03-04 NOTE — Patient Instructions (Signed)
Aviv it was great to see you today!  I am pleased to hear that things are going well for you. Please let me know if you wish to talk further about birth control options I would recommend the [facility] for you, which is the smallest IUD we have and causes the least amount of side effects. Let us know if you are interested. We would special order this for you.  Looking forward to seeing you soon Charlane FerrettiMelanie C Pinkie Manger, MD

## 2014-06-25 ENCOUNTER — Emergency Department (HOSPITAL_COMMUNITY): Payer: Medicaid Other

## 2014-06-25 ENCOUNTER — Encounter (HOSPITAL_COMMUNITY): Payer: Self-pay

## 2014-06-25 ENCOUNTER — Emergency Department (HOSPITAL_COMMUNITY): Payer: Self-pay

## 2014-06-25 ENCOUNTER — Emergency Department (HOSPITAL_COMMUNITY)
Admission: EM | Admit: 2014-06-25 | Discharge: 2014-06-25 | Disposition: A | Discharge status: Home / Self Care | Payer: Self-pay | Attending: Emergency Medicine | Admitting: Emergency Medicine

## 2014-06-25 DIAGNOSIS — Z8781 Personal history of (healed) traumatic fracture: Secondary | ICD-10-CM | POA: Insufficient documentation

## 2014-06-25 DIAGNOSIS — M24411 Recurrent dislocation, right shoulder: Secondary | ICD-10-CM | POA: Insufficient documentation

## 2014-06-25 DIAGNOSIS — IMO0001 Reserved for inherently not codable concepts without codable children: Secondary | ICD-10-CM

## 2014-06-25 DIAGNOSIS — Y998 Other external cause status: Secondary | ICD-10-CM | POA: Insufficient documentation

## 2014-06-25 DIAGNOSIS — X58XXXA Exposure to other specified factors, initial encounter: Secondary | ICD-10-CM | POA: Insufficient documentation

## 2014-06-25 DIAGNOSIS — Y9367 Activity, basketball: Secondary | ICD-10-CM | POA: Insufficient documentation

## 2014-06-25 DIAGNOSIS — Z9889 Other specified postprocedural states: Secondary | ICD-10-CM

## 2014-06-25 DIAGNOSIS — Y9289 Other specified places as the place of occurrence of the external cause: Secondary | ICD-10-CM | POA: Insufficient documentation

## 2014-06-25 MED ORDER — LIDOCAINE HCL (PF) 1 % IJ SOLN
5.0000 mL | Freq: Once | INTRAMUSCULAR | Status: AC
  Administered 2014-06-25: 5 mL via INTRADERMAL
  Filled 2014-06-25: qty 5

## 2014-06-25 MED ORDER — HYDROCODONE-ACETAMINOPHEN 5-325 MG PO TABS: 2.0000 | ORAL_TABLET | ORAL | Status: DC | PRN

## 2014-06-25 MED ORDER — FENTANYL CITRATE 0.05 MG/ML IJ SOLN
50.0000 ug | Freq: Once | INTRAMUSCULAR | Status: AC
  Administered 2014-06-25: 50 ug via INTRAVENOUS
  Filled 2014-06-25: qty 2

## 2014-06-25 NOTE — ED Notes (Signed)
Dr Davis at bedside.

## 2014-06-25 NOTE — ED Provider Notes (Signed)
CSN: [id number]     Arrival date & time 06/25/14  1730 History   First MD Initiated Contact with Patient 06/25/14 2125     Chief Complaint  Patient presents with  . Shoulder Pain     (Consider location/radiation/quality/duration/timing/severity/associated sxs/prior Treatment) HPI Patient is a 23 year old female presents complaining of shoulder pain. 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 His that approximately 4:30 PM today she was playing with some friends when she felt her shoulder pop. She knew immediately that it was dislocated. She could not extend it above her head or externally rotated.  She currently complains of pain as a 5 out of 10 in her right shoulder. She denies any numbness in her arm. Past Medical History  Diagnosis Date  . Dislocation of right shoulder joint   . Scaphoid fracture, wrist, closed     right   History reviewed. No pertinent past surgical history. Family History  Problem Relation Age of Onset  . Hypertension Mother   . Hypertension Maternal Grandmother   . Diabetes Maternal Grandmother   . Hyperlipidemia Maternal Grandmother    History  Substance Use Topics  . Smoking status: Never Smoker   . Smokeless tobacco: Not on file  . Alcohol Use: Yes   OB History    No data available     Review of Systems  Musculoskeletal: Positive for arthralgias.  Neurological: Negative for numbness.  All other systems reviewed and are negative.     Allergies  Review of patient's allergies indicates no known allergies.  Home Medications   Prior to Admission medications   Medication Sig Start Date End Date Taking? Authorizing Provider  oxyCODONE-acetaminophen (PERCOCET/ROXICET) 5-325 MG per tablet Take 1 tablet by mouth every 4 (four) hours as needed for severe pain. 01/23/14   Joya Gaskinsonald W Wickline, MD   BP 124/80 mmHg  Pulse 88  Temp(Src) 97.4 F (36.3 C) (Oral)  Resp 14  SpO2 100%  LMP 06/11/2014 Physical Exam   Constitutional: She is oriented to person, place, and time. She appears well-developed and well-nourished.  HENT:  Head: Normocephalic and atraumatic.  Eyes: EOM are normal. Pupils are equal, round, and reactive to light.  Neck: Normal range of motion. Neck supple.  Cardiovascular: Normal rate and regular rhythm.   Pulmonary/Chest: Effort normal and breath sounds normal.  Abdominal: Soft. There is no tenderness.  Musculoskeletal:       Right shoulder: She exhibits decreased range of motion, deformity and pain. She exhibits normal pulse ( Normal sensation in all nerve distributions of arm).  Neurological: She is alert and oriented to person, place, and time.  Skin: Skin is warm.  Psychiatric: She has a normal mood and affect.  Nursing note and vitals reviewed.   ED Course  Reduction of dislocation Date/Time: 06/25/2014 11:27 PM Performed by: Erskine EmeryAVIS, Ivan Maskell Authorized by: Erskine EmeryAVIS, [place] Consent: Verbal consent obtained. Risks and benefits: risks, benefits and alternatives were discussed Consent given by: patient Local anesthesia used: yes Anesthesia: local infiltration Local anesthetic: lidocaine 1% without epinephrine Anesthetic total: 5 ml Patient sedated: no Patient tolerance: Patient tolerated the procedure well with no immediate complications   (including critical care time) Labs Review Labs Reviewed - No data to display  Imaging Review Dg Shoulder Right  06/25/2014   CLINICAL DATA:  Status post right shoulder reduction  EXAM: RIGHT SHOULDER - 2+ VIEW  COMPARISON:  Film from earlier in the same day  FINDINGS: There is been interval reduction of the previously  seen right humeral dislocation. No acute fracture is seen. No soft tissue abnormality is noted.  IMPRESSION: Reduction of previously seen dislocation.   Electronically Signed   By: Alcide CleverMark  Lukens M.D.   On: 06/25/2014 22:56   Dg Shoulder Right  06/25/2014   CLINICAL DATA:  Right shoulder dislocation following throwing a  basketball. Obvious deformity.  EXAM: RIGHT SHOULDER - 2+ VIEW  COMPARISON:  None.  FINDINGS: Anterior inferior dislocation of the right humeral head relative to the glenoid. No fracture. Normal acromioclavicular joint. Normal surrounding soft tissues.  IMPRESSION: Anterior inferior dislocation of the right humeral head relative to the glenoid.   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 Patient had normal neurovascular exam of the upper extremity on her presentation. She had obvious deformity to her shoulder, and x-ray confirms anterior dislocation.  Intra-articular block was performed using lidocaine. Reduction was performed using traction, scapular manipulation and external rotation.  Post reduction films were obtained that show adequate reduction. Patient had a normal neurovascular exam following reduction. She is placed in a shoulder sling and she will follow up with orthopedics in 2 weeks.  Erskine Emeryhris Dionis Autry, MD 06/25/14 [id number]  Richardean Canalavid H Yao, MD 06/26/14 [phone]

## 2014-06-25 NOTE — Discharge Instructions (Signed)
Sling Use After Injury or Surgery A sling is used after an injury or a surgery. The sling protects and keeps you from using the injured part. A sling will also give rest and support to the injured part. HOME CARE   Do not use your shoulder until told by your doctor.  Keep all doctor visits.  Put ice on the injured area.  Put ice in a plastic bag.  Place a towel between your skin and the bag.  Leave the ice on for 15-20 minutes, 03-04 times a day.  Pad your elbow if you have numbness in your fingers.  Keep your arm on your chest when lying down.  Wear your plaster splint (if given) until you see your doctor again. Rest it on nothing harder than a pillow for 24 hours.  Do not get your arm wet if you have a plaster splint.  Wear your elastic bandage (if given) as told by your doctor.  Only take medicine as told by your doctor.  Exercise your shoulder as told by your doctor. Stop exercising if you have pain. GET HELP RIGHT AWAY IF:   You have an increase in bruising, puffiness (swelling), or pain.  You notice a blue color or coldness in your fingers.  Your pain is not helped by medicine  Any of your problems are getting worse. MAKE SURE YOU:  Understand these instructions.  Will watch your condition.  Will get help right away if you are not doing well or get worse. Document Released: 10/18/2009 Document Revised: 07/10/2012 Document Reviewed: 10/18/2009 [facility] StarExitCare Patient Information 2015 [place], [place]. This information is not intended to replace advice given to you by your health care provider. Make sure you discuss any questions you have with your health care provider.  Shoulder Dislocation  Shoulder dislocation is when your upper arm bone (humerus) is forced out of your shoulder joint. Your doctor will put your shoulder back into the joint by pulling on your arm or through surgery. Your arm will be placed in a shoulder immobilizer or sling. The shoulder immobilizer or  sling holds your shoulder in place while it heals. HOME CARE   Rest your injured joint. Do not move it until instructed to do so.  Put ice on your injured joint as told by your doctor.  Put ice in a plastic bag.  Place a towel between your skin and the bag.  Leave the ice on for 15-20 minutes at a time, every 2 hours while you are awake.  Only take medicines as told by your doctor.  Squeeze a ball to exercise your hand. GET HELP RIGHT AWAY IF:   Your splint or sling becomes damaged.  Your pain becomes worse, not better.  You lose feeling in your arm or hand.  Your arm or hand becomes white or cold. MAKE SURE YOU:   Understand these instructions.  Will watch your condition.  Will get help right away if you are not doing well or get worse. Document Released: 10/16/2011 Document Reviewed: 10/16/2011 [facility] LLCExitCare Patient Information 2015 [place]Care, [place]LL[place]. This information is not intended to replace advice given to you by your health care provider. Make sure you discuss any questions you have with your health care provider.

## 2014-06-25 NOTE — ED Notes (Signed)
Pt is having right shoulder pain. Hx of dislocating. Pt feels like it is out now. Good pulses. Hurts to move right arm.

## 2014-08-13 ENCOUNTER — Emergency Department (HOSPITAL_COMMUNITY)
Admission: EM | Admit: 2014-08-13 | Discharge: 2014-08-13 | Discharge status: Absent without leave | Payer: Medicaid Other | Source: Home / Self Care

## 2014-08-23 ENCOUNTER — Emergency Department (HOSPITAL_COMMUNITY): Payer: Medicaid Other

## 2014-08-23 ENCOUNTER — Encounter (HOSPITAL_COMMUNITY): Payer: Self-pay | Admitting: Emergency Medicine

## 2014-08-23 ENCOUNTER — Emergency Department (HOSPITAL_COMMUNITY)
Admission: EM | Admit: 2014-08-23 | Discharge: 2014-08-23 | Disposition: A | Discharge status: Home / Self Care | Payer: Medicaid Other | Attending: Emergency Medicine | Admitting: Emergency Medicine

## 2014-08-23 DIAGNOSIS — R1084 Generalized abdominal pain: Secondary | ICD-10-CM | POA: Insufficient documentation

## 2014-08-23 DIAGNOSIS — Z87828 Personal history of other (healed) physical injury and trauma: Secondary | ICD-10-CM | POA: Insufficient documentation

## 2014-08-23 DIAGNOSIS — Z3202 Encounter for pregnancy test, result negative: Secondary | ICD-10-CM | POA: Insufficient documentation

## 2014-08-23 DIAGNOSIS — Z8781 Personal history of (healed) traumatic fracture: Secondary | ICD-10-CM | POA: Insufficient documentation

## 2014-08-23 LAB — URINALYSIS, ROUTINE W REFLEX MICROSCOPIC
Bilirubin Urine: NEGATIVE
Glucose, UA: NEGATIVE mg/dL
HGB URINE DIPSTICK: NEGATIVE
Ketones, ur: NEGATIVE mg/dL
LEUKOCYTES UA: NEGATIVE
Nitrite: NEGATIVE
PH: 6 (ref 5.0–8.0)
Protein, ur: NEGATIVE mg/dL
Specific Gravity, Urine: 1.023 (ref 1.005–1.030)
Urobilinogen, UA: 0.2 mg/dL (ref 0.0–1.0)

## 2014-08-23 LAB — I-STAT CHEM 8, ED
BUN: 5 mg/dL — ABNORMAL LOW (ref 6–23)
CALCIUM ION: 1.14 mmol/L (ref 1.12–1.23)
CREATININE: 0.7 mg/dL (ref 0.50–1.10)
Chloride: 104 mEq/L (ref 96–112)
Glucose, Bld: 92 mg/dL (ref 70–99)
HCT: 31 % — ABNORMAL LOW (ref 36.0–46.0)
HEMOGLOBIN: 10.5 g/dL — AB (ref 12.0–15.0)
Potassium: 3.2 mmol/L — ABNORMAL LOW (ref 3.5–5.1)
Sodium: 140 mmol/L (ref 135–145)
TCO2: 20 mmol/L (ref 0–100)

## 2014-08-23 LAB — CBC WITH DIFFERENTIAL/PLATELET
Basophils Absolute: 0 10*3/uL (ref 0.0–0.1)
Basophils Relative: 0 % (ref 0–1)
EOS ABS: 0.1 10*3/uL (ref 0.0–0.7)
Eosinophils Relative: 1 % (ref 0–5)
HCT: 31.5 % — ABNORMAL LOW (ref 36.0–46.0)
HEMOGLOBIN: 10.4 g/dL — AB (ref 12.0–15.0)
Lymphocytes Relative: 10 % — ABNORMAL LOW (ref 12–46)
Lymphs Abs: 1.1 10*3/uL (ref 0.7–4.0)
MCH: 30.8 pg (ref 26.0–34.0)
MCHC: 33 g/dL (ref 30.0–36.0)
MCV: 93.2 fL (ref 78.0–100.0)
Monocytes Absolute: 1 10*3/uL (ref 0.1–1.0)
Monocytes Relative: 10 % (ref 3–12)
Neutro Abs: 8.8 10*3/uL — ABNORMAL HIGH (ref 1.7–7.7)
Neutrophils Relative %: 79 % — ABNORMAL HIGH (ref 43–77)
PLATELETS: 190 10*3/uL (ref 150–400)
RBC: 3.38 MIL/uL — ABNORMAL LOW (ref 3.87–5.11)
RDW: 12.3 % (ref 11.5–15.5)
WBC: 10.9 10*3/uL — ABNORMAL HIGH (ref 4.0–10.5)

## 2014-08-23 LAB — COMPREHENSIVE METABOLIC PANEL
ALK PHOS: 57 U/L (ref 39–117)
ALT: 10 U/L (ref 0–35)
AST: 14 U/L (ref 0–37)
Albumin: 3.6 g/dL (ref 3.5–5.2)
Anion gap: 3 — ABNORMAL LOW (ref 5–15)
BILIRUBIN TOTAL: 1.3 mg/dL — AB (ref 0.3–1.2)
BUN: 8 mg/dL (ref 6–23)
CO2: 23 mmol/L (ref 19–32)
Calcium: 8.3 mg/dL — ABNORMAL LOW (ref 8.4–10.5)
Chloride: 111 mEq/L (ref 96–112)
Creatinine, Ser: 0.7 mg/dL (ref 0.50–1.10)
GFR calc Af Amer: >90 mL/min (ref >90)
GFR calc non Af Amer: >90 mL/min (ref >90)
Glucose, Bld: 100 mg/dL — ABNORMAL HIGH (ref 70–99)
POTASSIUM: 3.3 mmol/L — AB (ref 3.5–5.1)
Sodium: 136 mmol/L (ref 135–145)
TOTAL PROTEIN: 6.7 g/dL (ref 6.0–8.3)

## 2014-08-23 LAB — LIPASE, BLOOD: Lipase: 22 U/L (ref 11–59)

## 2014-08-23 LAB — POC URINE PREG, ED: Preg Test, Ur: NEGATIVE

## 2014-08-23 MED ORDER — ONDANSETRON 4 MG PO TBDP: 4.0000 mg | ORAL_TABLET | Freq: Once | ORAL | Status: DC

## 2014-08-23 MED ORDER — DICYCLOMINE HCL 20 MG PO TABS: 20.0000 mg | ORAL_TABLET | Freq: Three times a day (TID) | ORAL | Status: DC

## 2014-08-23 MED ORDER — POTASSIUM CHLORIDE CRYS ER 20 MEQ PO TBCR
40.0000 meq | EXTENDED_RELEASE_TABLET | Freq: Once | ORAL | Status: AC
  Administered 2014-08-23: 40 meq via ORAL
  Filled 2014-08-23: qty 2

## 2014-08-23 MED ORDER — ONDANSETRON HCL 4 MG/2ML IJ SOLN
4.0000 mg | Freq: Once | INTRAMUSCULAR | Status: AC
  Administered 2014-08-23: 4 mg via INTRAVENOUS

## 2014-08-23 MED ORDER — ONDANSETRON 4 MG PO TBDP: 4.0000 mg | ORAL_TABLET | Freq: Three times a day (TID) | ORAL | Status: DC | PRN

## 2014-08-23 MED ORDER — DICYCLOMINE HCL 10 MG PO CAPS
20.0000 mg | ORAL_CAPSULE | Freq: Once | ORAL | Status: DC
  Filled 2014-08-23: qty 2

## 2014-08-23 MED ORDER — DICYCLOMINE HCL 10 MG/ML IM SOLN
20.0000 mg | Freq: Once | INTRAMUSCULAR | Status: AC
  Administered 2014-08-23: 20 mg via INTRAMUSCULAR
  Filled 2014-08-23: qty 2

## 2014-08-23 MED ORDER — KETOROLAC TROMETHAMINE 30 MG/ML IJ SOLN
30.0000 mg | Freq: Once | INTRAMUSCULAR | Status: AC
  Administered 2014-08-23: 30 mg via INTRAVENOUS
  Filled 2014-08-23: qty 1

## 2014-08-23 MED ORDER — SODIUM CHLORIDE 0.9 % IV BOLUS (SEPSIS)
1000.0000 mL | Freq: Once | INTRAVENOUS | Status: AC
  Administered 2014-08-23: 1000 mL via INTRAVENOUS

## 2014-08-23 NOTE — ED Notes (Signed)
Pt asking for apple juice denies any nausea.

## 2014-08-23 NOTE — ED Notes (Signed)
Pt c/o abd pain that started last night. Pt denies n/v/d.

## 2014-08-23 NOTE — Discharge Instructions (Signed)
Your labs today, abdominal x-ray and urine were normal. I recommended you increase your fiber intake, drink plain water. Avoid spicy, acidic, greasy foods. If your pain becomes worse or he develops fever, vomiting, blood in your stools are black and tarry stools, please return to the hospital.  Abdominal Pain, Women Abdominal (stomach, pelvic, or belly) pain can be caused by many things. It is important to tell your doctor:  The location of the pain.  Does it come and go or is it present all the time?  Are there things that start the pain (eating certain foods, exercise)?  Are there other symptoms associated with the pain (fever, nausea, vomiting, diarrhea)? All of this is helpful to know when trying to find the cause of the pain. CAUSES   Stomach: virus or bacteria infection, or ulcer.  Intestine: appendicitis (inflamed appendix), regional ileitis (Crohn's disease), ulcerative colitis (inflamed colon), irritable bowel syndrome, diverticulitis (inflamed diverticulum of the colon), or cancer of the stomach or intestine.  Gallbladder disease or stones in the gallbladder.  Kidney disease, kidney stones, or infection.  Pancreas infection or cancer.  Fibromyalgia (pain disorder).  Diseases of the female organs:  Uterus: fibroid (non-cancerous) tumors or infection.  Fallopian tubes: infection or tubal pregnancy.  Ovary: cysts or tumors.  Pelvic adhesions (scar tissue).  Endometriosis (uterus lining tissue growing in the pelvis and on the pelvic organs).  Pelvic congestion syndrome (female organs filling up with blood just before the menstrual period).  Pain with the menstrual period.  Pain with ovulation (producing an egg).  Pain with an IUD (intrauterine device, birth control) in the uterus.  Cancer of the female organs.  Functional pain (pain not caused by a disease, may improve without treatment).  Psychological pain.  Depression. DIAGNOSIS  Your doctor will decide  the seriousness of your pain by doing an examination.  Blood tests.  X-rays.  Ultrasound.  CT scan (computed tomography, special type of X-ray).  MRI (magnetic resonance imaging).  Cultures, for infection.  Barium enema (dye inserted in the large intestine, to better view it with X-rays).  Colonoscopy (looking in intestine with a lighted tube).  Laparoscopy (minor surgery, looking in abdomen with a lighted tube).  Major abdominal exploratory surgery (looking in abdomen with a large incision). TREATMENT  The treatment will depend on the cause of the pain.   Many cases can be observed and treated at home.  Over-the-counter medicines recommended by your caregiver.  Prescription medicine.  Antibiotics, for infection.  Birth control pills, for painful periods or for ovulation pain.  Hormone treatment, for endometriosis.  Nerve blocking injections.  Physical therapy.  Antidepressants.  Counseling with a psychologist or psychiatrist.  Minor or major surgery. HOME CARE INSTRUCTIONS   Do not take laxatives, unless directed by your caregiver.  Take over-the-counter pain medicine only if ordered by your caregiver. Do not take aspirin because it can cause an upset stomach or bleeding.  Try a clear liquid diet (broth or water) as ordered by your caregiver. Slowly move to a bland diet, as tolerated, if the pain is related to the stomach or intestine.  Have a thermometer and take your temperature several times a day, and record it.  Bed rest and sleep, if it helps the pain.  Avoid sexual intercourse, if it causes pain.  Avoid stressful situations.  Keep your follow-up appointments and tests, as your caregiver orders.  If the pain does not go away with medicine or surgery, you may try:  Acupuncture.  Relaxation exercises (yoga, meditation).  Group therapy.  Counseling. SEEK MEDICAL CARE IF:   You notice certain foods cause stomach pain.  Your home care  treatment is not helping your pain.  You need stronger pain medicine.  You want your IUD removed.  You feel faint or lightheaded.  You develop nausea and vomiting.  You develop a rash.  You are having side effects or an allergy to your medicine. SEEK IMMEDIATE MEDICAL CARE IF:   Your pain does not go away or gets worse.  You have a fever.  Your pain is felt only in portions of the abdomen. The right side could possibly be appendicitis. The left lower portion of the abdomen could be colitis or diverticulitis.  You are passing blood in your stools (bright red or black tarry stools, with or without vomiting).  You have blood in your urine.  You develop chills, with or without a fever.  You pass out. MAKE SURE YOU:   Understand these instructions.  Will watch your condition.  Will get help right away if you are not doing well or get worse. Document Released: 05/21/2007 Document Revised: 12/08/2013 Document Reviewed: 06/10/2009 [facility] Patient Information 2015 [place], [place]. This information is not intended to replace advice given to you by your health care provider. Make sure you discuss any questions you have with your health care provider.   High-Fiber Diet Fiber is found in fruits, vegetables, and grains. A high-fiber diet encourages the addition of more whole grains, legumes, fruits, and vegetables in your diet. The recommended amount of fiber for adult males is 38 g per day. For adult females, it is 25 g per day. Pregnant and lactating women should get 28 g of fiber per day. If you have a digestive or bowel problem, ask your caregiver for advice before adding high-fiber foods to your diet. Eat a variety of high-fiber foods instead of only a select few type of foods.  PURPOSE  To increase stool bulk.  To make bowel movements more regular to prevent constipation.  To lower cholesterol.  To prevent overeating. WHEN IS THIS DIET USED?  It may be used if you have  constipation and hemorrhoids.  It may be used if you have uncomplicated diverticulosis (intestine condition) and irritable bowel syndrome.  It may be used if you need help with weight management.  It may be used if you want to add it to your diet as a protective measure against atherosclerosis, diabetes, and cancer. SOURCES OF FIBER  Whole-grain breads and cereals.  Fruits, such as apples, oranges, bananas, berries, prunes, and pears.  Vegetables, such as green peas, carrots, sweet potatoes, beets, broccoli, cabbage, spinach, and artichokes.  Legumes, such split peas, soy, lentils.  Almonds. FIBER CONTENT IN FOODS Starches and Grains / Dietary Fiber (g)  Cheerios, 1 cup / 3 g  Corn Flakes cereal, 1 cup / 0.7 g  Rice crispy treat cereal, 1 cup / 0.3 g  Instant oatmeal (cooked),  cup / 2 g  Frosted wheat cereal, 1 cup / 5.1 g  Brown, long-grain rice (cooked), 1 cup / 3.5 g  White, long-grain rice (cooked), 1 cup / 0.6 g  Enriched macaroni (cooked), 1 cup / 2.5 g Legumes / Dietary Fiber (g)  Baked beans (canned, plain, or vegetarian),  cup / 5.2 g  Kidney beans (canned),  cup / 6.8 g  Pinto beans (cooked),  cup / 5.5 g Breads and Crackers / Dietary Fiber (g)  Plain or honey graham  crackers, 2 squares / 0.7 g  Saltine crackers, 3 squares / 0.3 g  Plain, salted pretzels, 10 pieces / 1.8 g  Whole-wheat bread, 1 slice / 1.9 g  White bread, 1 slice / 0.7 g  Raisin bread, 1 slice / 1.2 g  Plain bagel, 3 oz / 2 g  Flour tortilla, 1 oz / 0.9 g  Corn tortilla, 1 small / 1.5 g  Hamburger or hotdog bun, 1 small / 0.9 g Fruits / Dietary Fiber (g)  Apple with skin, 1 medium / 4.4 g  Sweetened applesauce,  cup / 1.5 g  Banana,  medium / 1.5 g  Grapes, 10 grapes / 0.4 g  Orange, 1 small / 2.3 g  Raisin, 1.5 oz / 1.6 g  Melon, 1 cup / 1.4 g Vegetables / Dietary Fiber (g)  Green beans (canned),  cup / 1.3 g  Carrots (cooked),  cup / 2.3  g  Broccoli (cooked),  cup / 2.8 g  Peas (cooked),  cup / 4.4 g  Mashed potatoes,  cup / 1.6 g  Lettuce, 1 cup / 0.5 g  Corn (canned),  cup / 1.6 g  Tomato,  cup / 1.1 g Document Released: 07/24/2005 Document Revised: 01/23/2012 Document Reviewed: 10/26/2011 [employer] Patient Information 2015 [place], [place]. This information is not intended to replace advice given to you by your health care provider. Make sure you discuss any questions you have with your health care provider.

## 2014-08-23 NOTE — ED Provider Notes (Signed)
TIME SEEN: 9:50 AM  CHIEF COMPLAINT: Abdominal pain  HPI: Pt is a 24 y.o. female with history of prior right shoulder dislocation who presents to the emergency department with complaints of abdominal pain. Reports that around January 2 she began having right lower abdominal pain that was intermittent. She states she now feels like her pain is "moved everywhere" and is sharp and involving her entire abdomen and started last night. She states it is worse with trying to have a bowel movement and trying to urinate. She has had dysuria. No hematuria. She is currently menstruating. This is a normal period for her. No vaginal bleeding. States she had a bowel movement at 2 AM that was normal. She is passing gas. No bloody stool or melena. Denies fevers or chills, nausea, vomiting or diarrhea. No prior abdominal surgery. No sick contacts or recent travel.  ROS: See HPI Constitutional: no fever  Eyes: no drainage  ENT: no runny nose   Cardiovascular:  no chest pain  Resp: no SOB  GI: no vomiting GU: no dysuria Integumentary: no rash  Allergy: no hives  Musculoskeletal: no leg swelling  Neurological: no slurred speech ROS otherwise negative  PAST MEDICAL HISTORY/PAST SURGICAL HISTORY:  Past Medical History  Diagnosis Date  . Dislocation of right shoulder joint   . Scaphoid fracture, wrist, closed     right    MEDICATIONS:  Prior to Admission medications   Medication Sig Start Date End Date Taking? Authorizing Provider  HYDROcodone-acetaminophen (NORCO/VICODIN) 5-325 MG per tablet Take 2 tablets by mouth every 4 (four) hours as needed for moderate pain or severe pain. 06/25/14   Erskine Emeryhris Davis, MD  oxyCODONE-acetaminophen (PERCOCET/ROXICET) 5-325 MG per tablet Take 1 tablet by mouth every 4 (four) hours as needed for severe pain. Patient not taking: Reported on 06/25/2014 01/23/14   Joya Gaskinsonald W Wickline, MD    ALLERGIES:  Allergies  Allergen Reactions  . Bee Venom Swelling    SOCIAL HISTORY:   History  Substance Use Topics  . Smoking status: Never Smoker   . Smokeless tobacco: Not on file  . Alcohol Use: Yes    FAMILY HISTORY: Family History  Problem Relation Age of Onset  . Hypertension Mother   . Hypertension Maternal Grandmother   . Diabetes Maternal Grandmother   . Hyperlipidemia Maternal Grandmother     EXAM: BP 105/62 mmHg  Pulse 98  Temp(Src) 98.6 F (37 C) (Oral)  Resp 16  SpO2 99%  LMP 08/20/2014 CONSTITUTIONAL: Alert and oriented and responds appropriately to questions. Well-appearing; well-nourished HEAD: Normocephalic EYES: Conjunctivae clear, PERRL ENT: normal nose; no rhinorrhea; moist mucous membranes; pharynx without lesions noted NECK: Supple, no meningismus, no LAD  CARD: RRR; S1 and S2 appreciated; no murmurs, no clicks, no rubs, no gallops RESP: Normal chest excursion without splinting or tachypnea; breath sounds clear and equal bilaterally; no wheezes, no rhonchi, no rales,  ABD/GI: Normal bowel sounds; non-distended; soft, very mildly tender to palpation diffusely without guarding or rebound, no peritoneal signs, no tenderness at McBurney's point, negative Murphy sign BACK:  The back appears normal and is non-tender to palpation, there is no CVA tenderness EXT: Normal ROM in all joints; non-tender to palpation; no edema; normal capillary refill; no cyanosis    SKIN: Normal color for age and race; warm NEURO: Moves all extremities equally PSYCH: The patient's mood and manner are appropriate. Grooming and personal hygiene are appropriate.  MEDICAL DECISION MAKING: Pt here with mild abdominal pain with very benign exam. Discussed  with patient differential includes UTI, constipation but less likely cholecystitis, pancreatitis, appendicitis. Will obtain abdominal labs, urine, acute abdominal series. We'll give Toradol, IV fluid, Bentyl, Zofran and reassess.  ED PROGRESS: Patient's potassium slightly low. Replace with oral potassium. She does have  mild leukocytosis with left shift. Her abdominal exam still benign. Urine shows no sign of infection, urine pregnancy negative. Acute abdominal series unremarkable. She reports feeling better after IV fluids, Toradol, Bentyl and Zofran. We'll discharge home with prescriptions for Bentyl and Zofran. Discussed return precautions. Patient verbalizes understanding and is comfortable with plan.    EKG Interpretation  Date/Time:  Sunday August 23 2014 11:23:40 EST Ventricular Rate:  87 PR Interval:  135 QRS Duration: 80 QT Interval:  333 QTC Calculation: 400 R Axis:   81 Text Interpretation:  Sinus rhythm Confirmed by WARD,  DO, KRISTEN ([id number]) on 08/23/2014 11:43:33 AM        Whitney Maw Ward, DO 08/23/14 1300

## 2014-09-11 ENCOUNTER — Ambulatory Visit: Payer: Medicaid Other | Admitting: Family Medicine

## 2014-09-18 ENCOUNTER — Ambulatory Visit (INDEPENDENT_AMBULATORY_CARE_PROVIDER_SITE_OTHER): Payer: Medicaid Other | Admitting: Family Medicine

## 2014-09-18 ENCOUNTER — Encounter: Payer: Self-pay | Admitting: Family Medicine

## 2014-09-18 VITALS — BP 105/73 | HR 75 | Temp 99.1°F | Ht 69.0 in | Wt 130.0 lb

## 2014-09-18 DIAGNOSIS — E876 Hypokalemia: Secondary | ICD-10-CM

## 2014-09-18 DIAGNOSIS — D649 Anemia, unspecified: Secondary | ICD-10-CM

## 2014-09-18 DIAGNOSIS — R1031 Right lower quadrant pain: Secondary | ICD-10-CM

## 2014-09-18 LAB — BASIC METABOLIC PANEL
BUN: 8 mg/dL (ref 6–23)
CALCIUM: 9.5 mg/dL (ref 8.4–10.5)
CHLORIDE: 104 meq/L (ref 96–112)
CO2: 25 meq/L (ref 19–32)
CREATININE: 0.69 mg/dL (ref 0.50–1.10)
Glucose, Bld: 93 mg/dL (ref 70–99)
Potassium: 4.1 mEq/L (ref 3.5–5.3)
SODIUM: 137 meq/L (ref 135–145)

## 2014-09-18 NOTE — Assessment & Plan Note (Signed)
K+ Checked today. I will call her with result.

## 2014-09-18 NOTE — Patient Instructions (Addendum)
It was nice seeing you today, I am glad you now feel better with your stomach pain. If this reoccurs please give us a call as soon as possible, we might need to get an Ultrasound to check your ovaries. Please take multivitamins and iron pills for your anemia. You can get the iron pill OTC and take 1 tablet twice a day.

## 2014-09-18 NOTE — Assessment & Plan Note (Signed)
No acute loss. Recent CBC with Hbg of 10. She is asymptomatic, seem to be her baseline. I recommended Iron and MVI which she can obtain OTC. F/U with PCP in 2-3 months for recheck.

## 2014-09-18 NOTE — Progress Notes (Signed)
Subjective:     Patient ID: Whitney Carter, female   DOB: October 10, 1990, 24 y.o.   MRN: [id number]  HPI  Abdominal pain:Here for follow up from ED visit for RLQ abdominal pain which has since then resolved, she denies any GI symptoms today. Hypokalemia:She stated her K+ level was low during ED visit and was replaced, denies any concern today. Anemia: Denies any bleeding, here for follow up.  Current Outpatient Prescriptions on File Prior to Visit  Medication Sig Dispense Refill  . ibuprofen (ADVIL,MOTRIN) 200 MG tablet Take 800 mg by mouth every 6 (six) hours as needed for mild pain or moderate pain.    Marland Kitchen. dicyclomine (BENTYL) 20 MG tablet Take 1 tablet (20 mg total) by mouth 3 (three) times daily before meals. As needed for abdominal pain 20 tablet 0   No current facility-administered medications on file prior to visit.   Past Medical History  Diagnosis Date  . Dislocation of right shoulder joint   . Scaphoid fracture, wrist, closed     right      Review of Systems  Respiratory: Negative.   Cardiovascular: Negative.   Gastrointestinal: Negative.   Genitourinary: Negative.   All other systems reviewed and are negative.      Filed Vitals:   09/18/14 1036  BP: 105/73  Pulse: 75  Temp: 99.1 F (37.3 C)  TempSrc: Oral  Height: 5\' 9"  (1.753 m)  Weight: 130 lb (58.968 kg)    Objective:   Physical Exam  Constitutional: She is oriented to person, place, and time. She appears well-developed. No distress.  Cardiovascular: Normal rate, regular rhythm, normal heart sounds and intact distal pulses.   No murmur heard. Pulmonary/Chest: Effort normal and breath sounds normal. No respiratory distress. She has no wheezes. She exhibits no tenderness.  Abdominal: Soft. Bowel sounds are normal. She exhibits no distension and no mass. There is no tenderness. There is no rebound and no guarding.  Musculoskeletal: Normal range of motion.  Neurological: She is alert and oriented to person,  place, and time. No cranial nerve deficit.  Nursing note and vitals reviewed.      Assessment:     RLQ abdominal pain Hypokalemia  Anemia    Plan:     Check problem list.

## 2014-09-18 NOTE — Assessment & Plan Note (Signed)
??   Ovarian cyst. Symptoms improved. Monitor for now. If reoccurent we will obtain U/S

## 2014-09-22 ENCOUNTER — Telehealth: Payer: Self-pay | Admitting: Family Medicine

## 2014-09-22 NOTE — Telephone Encounter (Signed)
Message left for patient. K+ is normal. PCP follow up as needed.

## 2014-11-03 ENCOUNTER — Encounter: Payer: Self-pay | Admitting: Family Medicine

## 2014-11-03 ENCOUNTER — Ambulatory Visit (INDEPENDENT_AMBULATORY_CARE_PROVIDER_SITE_OTHER): Payer: Self-pay | Admitting: Family Medicine

## 2014-11-03 VITALS — BP 140/67 | HR 81 | Temp 98.4°F | Wt 131.0 lb

## 2014-11-03 DIAGNOSIS — J3089 Other allergic rhinitis: Secondary | ICD-10-CM

## 2014-11-03 NOTE — Assessment & Plan Note (Signed)
Current dry skin without signs of inflammation - Recommended moisturizing ointments; currently using cocoa butter - advised Vaseline at night, and avoiding hot showers - Discussed skin steroid creams and possibility of hypopigmentation especially on face. She declines steroid creams this time as have not previously worked for her - Stage managerContinue Zyrtec

## 2014-11-03 NOTE — Progress Notes (Signed)
   Subjective:    Patient ID: Whitney Carter, female    DOB: 07/12/1991, 24 y.o.   MRN: [id number]  Seen for Same day visit for   CC: Dry skin  She reports having an allergic reaction due to dogs several days ago in which her eyes swelled up.  She took Benadryl and Zyrtec, which alleviated her swelling.  She denies any wheezing or trouble breathing that time her previous asthma attacks.  She denies any throat swelling or difficulty swallowing.  Denies any fevers or chills.  She has a history of eczema as previously steroid creams with minimal benefit.  Reports seasonal allergies and current nasal congestion.   Review of Systems   See HPI for ROS. Objective:  BP 140/67 mmHg  Pulse 81  Temp(Src) 98.4 F (36.9 C) (Oral)  Wt 131 lb (59.421 kg)  LMP 09/22/2014 (Exact Date)  General: NAD HEENT: Swollen nasal turbinates b/l  Cardiac: RRR, normal heart sounds, no murmurs. 2+ radial and PT pulses bilaterally Respiratory: CTAB, normal effort Skin: Dry skin noted around eyes, chest and forearms.    Assessment & Plan:  See Problem List Documentation

## 2014-11-26 ENCOUNTER — Encounter: Payer: Self-pay | Admitting: Student

## 2014-11-26 ENCOUNTER — Other Ambulatory Visit (HOSPITAL_COMMUNITY)
Admission: RE | Admit: 2014-11-26 | Discharge: 2014-11-26 | Disposition: A | Discharge status: Home / Self Care | Payer: Medicaid Other | Source: Ambulatory Visit | Attending: Family Medicine | Admitting: Family Medicine

## 2014-11-26 ENCOUNTER — Ambulatory Visit (INDEPENDENT_AMBULATORY_CARE_PROVIDER_SITE_OTHER): Payer: Self-pay | Admitting: Student

## 2014-11-26 VITALS — BP 110/64 | HR 77 | Temp 98.3°F | Ht 69.0 in | Wt 130.0 lb

## 2014-11-26 DIAGNOSIS — Z01411 Encounter for gynecological examination (general) (routine) with abnormal findings: Secondary | ICD-10-CM | POA: Insufficient documentation

## 2014-11-26 DIAGNOSIS — Z113 Encounter for screening for infections with a predominantly sexual mode of transmission: Secondary | ICD-10-CM | POA: Insufficient documentation

## 2014-11-26 DIAGNOSIS — Z0000 Encounter for general adult medical examination without abnormal findings: Secondary | ICD-10-CM

## 2014-11-26 DIAGNOSIS — Z124 Encounter for screening for malignant neoplasm of cervix: Secondary | ICD-10-CM

## 2014-11-26 DIAGNOSIS — R1031 Right lower quadrant pain: Secondary | ICD-10-CM

## 2014-11-26 NOTE — Assessment & Plan Note (Signed)
As is has been ongoing for several months, will obtain pelvic US to rule out pathology like ovarian cyst - Pt will f/u after US complete

## 2014-11-26 NOTE — Progress Notes (Signed)
Discussed with the resident 

## 2014-11-26 NOTE — Patient Instructions (Signed)
Please follow up in 6 months or earlier as needed

## 2014-11-26 NOTE — Assessment & Plan Note (Addendum)
Pap smear today,will follow HIV, RPR, GC/CT today, will follow Pt counseled on drug, tobacco and etoh use. Denies using them currently

## 2014-11-26 NOTE — Progress Notes (Signed)
   Subjective:    Patient ID: Whitney Carter, female    DOB: November 05, 1990, 24 y.o.   MRN: [id number]   CC: Well woman  HPI: 24 y/o (LMP 11/05/2014) for Well woman exam  Feels well but reports daily intermittent sharp RLQ abdominal pain that has been occuring since January of this year. She denies nausea/vomitting, vaginal irritation or malodorous discharge. She denies changes in bowel habits and has regular formed bowel movements every day  Social:  She is interested in women and has never had intercourse with a man. She has not been sexually active with a woman since she was approximately [age]. She lives with her mother and feels safe and that her sexuality is accepted by her. She is about to finish trucking school and is happy about that      Review of Systems   See HPI for ROS. All other systems reviewed and are negative.  Past medical history, surgical, family, and social history reviewed and updated in the EMR as appropriate.  Past Medical History  Diagnosis Date  . Dislocation of right shoulder joint   . Scaphoid fracture, wrist, closed     right   @PSG @ OB History    No data available     History   Social History  . Marital Status: Single    Spouse Name: N/A  . Number of Children: N/A  . Years of Education: N/A   Occupational History  . Not on file.   Social History Main Topics  . Smoking status: Never Smoker   . Smokeless tobacco: Not on file  . Alcohol Use: Yes  . Drug Use: No  . Sexual Activity: Yes   Other Topics Concern  . Not on file   Social History Narrative    Objective:  BP 110/64 mmHg  Pulse 77  Temp(Src) 98.3 F (36.8 C) (Oral)  Ht 5\' 9"  (1.753 m)  Wt 130 lb (58.968 kg)  BMI 19.19 kg/m2  SpO2 97%  LMP 11/21/2014 (Approximate) Vitals and nursing note reviewed  General: NAD Cardiac: RRR, normal heart sounds, no murmurs. 2+ radial and PT pulses bilaterally Respiratory: CTAB, normal effort Abdomen: soft, mild RLQ tenderness no  rebound or guarding, non-distended, Bowel sounds present Extremities: no edema or cyanosis. WWP. Skin: warm and dry, no rashes noted Neuro: alert and oriented, no focal deficits  Pelvic: normal EGBUS, normal vagina, normal appearing cervix, small mobile uterus, mild right adnexal tenderness, no left adnexal tenderness, no CMT   Assessment & Plan:  See Problem List      Alyssa A. Kennon RoundsHaney MD, MS Family Medicine Resident PGY-1 Pager [phone]

## 2014-11-27 LAB — CERVICOVAGINAL ANCILLARY ONLY
CHLAMYDIA, DNA PROBE: NEGATIVE
Neisseria Gonorrhea: NEGATIVE

## 2014-11-27 LAB — HIV ANTIBODY (ROUTINE TESTING W REFLEX): HIV: NONREACTIVE

## 2014-11-27 LAB — RPR

## 2014-11-30 ENCOUNTER — Telehealth: Payer: Self-pay | Admitting: Student

## 2014-11-30 LAB — CYTOLOGY - PAP

## 2014-11-30 NOTE — Telephone Encounter (Signed)
LVM for patient to return my call. Please advise patient that her Ultrasound is scheduled for Tues Dec 08, 2014 at 3:45pm (please arrive at 3:30pm[facility] Please drink 32 oz of water 1 hr prior to appt. Whitney Carter K

## 2014-12-01 ENCOUNTER — Telehealth: Payer: Self-pay | Admitting: *Deleted

## 2014-12-01 NOTE — Telephone Encounter (Signed)
[facility] referencing and abnormal lab value (PAP).  MD paged 2x no response. Spoke with preceptor(Fletke) and he stated that this was not emergent but did need to be reviewed by the MD to determine next steps.  Forwarding this message to the MD to make her aware if she has not already seen the lab results.  Whitney Carter, April D

## 2014-12-03 ENCOUNTER — Telehealth: Payer: Self-pay | Admitting: Student

## 2014-12-03 ENCOUNTER — Other Ambulatory Visit: Payer: Self-pay | Admitting: Student

## 2014-12-03 DIAGNOSIS — Z124 Encounter for screening for malignant neoplasm of cervix: Secondary | ICD-10-CM

## 2014-12-03 NOTE — Telephone Encounter (Signed)
Pt called regarding HSIL pap and STD testing. After confirming two patient identifiers, pt was informed informed of her negative RPR and neg HIV. She was also informed of her HSIL test. She was counseled that this test is not diagnostic for cancer but we will need to further work her up with a colposcopy to determine therapy. Will plan to schedule for colposcopy in 1-2 weeks.  All questions were answered to the pt's satisfaction  Jahlia Omura A. Kennon RoundsHaney MD, MS Family Medicine Resident PGY-1 Pager [phone]

## 2014-12-08 ENCOUNTER — Ambulatory Visit (HOSPITAL_COMMUNITY)
Admission: RE | Admit: 2014-12-08 | Discharge: 2014-12-08 | Disposition: A | Discharge status: Home / Self Care | Payer: Medicaid Other | Source: Ambulatory Visit | Attending: Family Medicine | Admitting: Family Medicine

## 2014-12-08 ENCOUNTER — Other Ambulatory Visit: Payer: Self-pay | Admitting: Student

## 2014-12-08 DIAGNOSIS — R1031 Right lower quadrant pain: Secondary | ICD-10-CM | POA: Insufficient documentation

## 2014-12-08 DIAGNOSIS — Q5039 Other congenital malformation of ovary: Secondary | ICD-10-CM | POA: Insufficient documentation

## 2014-12-08 DIAGNOSIS — Z124 Encounter for screening for malignant neoplasm of cervix: Secondary | ICD-10-CM

## 2014-12-08 DIAGNOSIS — R102 Pelvic and perineal pain: Secondary | ICD-10-CM | POA: Diagnosis not present

## 2014-12-09 NOTE — Progress Notes (Unsigned)
Pelvic US shows right ovarian 7.1 x 7.6 x 6.0 cm isoechoic mass with homogeneous low level internal echoes. Will need referral to Gyn to discuss treatment options. Pt will be called regarding results of this imaging study. Mass seems consistent with hemorrhagic cysts vs endometrioma, will need to be followed with repeat US in 6-12 weeks. Given it's size, discussion should be held regarding surgery as she is at risk for ovarian torsion. The duration of time that she has had this mass in unknown but she has had discomfort in her right lower abdomen for several months indicating that it may have been present for some time.  [facility] to [facility]  Whitney Schopf A. Kennon RoundsHaney MD, MS Family Medicine Resident PGY-1 Pager [phone]

## 2014-12-10 NOTE — Telephone Encounter (Signed)
Please place referral and [facility].  Whitney Carter,CMA

## 2014-12-10 NOTE — Telephone Encounter (Signed)
Pt called regarding the results of her pelvic US. After confirming her identity with two pt identifiers, she was informed of the 7 cm right ovarian cyst. She will need referral to Gyn for consideration of management. Given its size and echogenic nature in setting of known dysmenorrhea and would suspect endometriosis with endometrioma and need for cystectomy ED precautions reviewed.   Jaquel Glassburn A. Kennon RoundsHaney MD, MS Family Medicine Resident PGY-1 Pager [phone]

## 2014-12-13 ENCOUNTER — Other Ambulatory Visit: Payer: Self-pay | Admitting: Student

## 2014-12-13 DIAGNOSIS — N83201 Unspecified ovarian cyst, right side: Secondary | ICD-10-CM

## 2014-12-17 ENCOUNTER — Ambulatory Visit (INDEPENDENT_AMBULATORY_CARE_PROVIDER_SITE_OTHER): Payer: Self-pay | Admitting: Family Medicine

## 2014-12-17 VITALS — BP 118/70 | HR 90 | Temp 98.4°F | Ht 69.0 in | Wt 131.7 lb

## 2014-12-17 DIAGNOSIS — R87613 High grade squamous intraepithelial lesion on cytologic smear of cervix (HGSIL): Secondary | ICD-10-CM

## 2014-12-17 LAB — POCT URINE PREGNANCY: Preg Test, Ur: NEGATIVE

## 2014-12-17 NOTE — Progress Notes (Signed)
Patient ID: Whitney Carter, female   DOB: 08-11-1990, 24 y.o.   MRN: [id number] Patient ID: Whitney AngerJazmine S Penning, female   DOB: 08-11-1990, 24 y.o.   MRN: [id number]  Chief Complaint  Patient presents with  . Colposcopy    HPI Whitney AngerJazmine S Tome is a 24 y.o. female.  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 Previous colposcopy: none. Prior cervical treatment: no treatment.  Past Medical History  Diagnosis Date  . Dislocation of right shoulder joint   . Scaphoid fracture, wrist, closed     right    No past surgical history on file.  Family History  Problem Relation Age of Onset  . Hypertension Mother   . Hypertension Maternal Grandmother   . Diabetes Maternal Grandmother   . Hyperlipidemia Maternal Grandmother     Social History History  Substance Use Topics  . Smoking status: Never Smoker   . Smokeless tobacco: Not on file  . Alcohol Use: Yes    Allergies  Allergen Reactions  . Bee Venom Swelling    Current Outpatient Prescriptions  Medication Sig Dispense Refill  . dicyclomine (BENTYL) 20 MG tablet Take 1 tablet (20 mg total) by mouth 3 (three) times daily before meals. As needed for abdominal pain 20 tablet 0  . ibuprofen (ADVIL,MOTRIN) 200 MG tablet Take 800 mg by mouth every 6 (six) hours as needed for mild pain or moderate pain.     No current facility-administered medications for this visit.    Review of Systems Review of Systems  Blood pressure 118/70, pulse 90, temperature 98.4 F (36.9 C), temperature source Oral, height 5\' 9"  (1.753 m), weight 131 lb 11.2 oz (59.739 kg), last menstrual period 11/21/2014.  Physical Exam Physical Exam  Data Reviewed Pap result April 2016 No HPV result available  Assessment    Procedure Details  The risks and benefits of the procedure and Written informed consent  obtained.  Speculum placed in vagina and excellent visualization of cervix achieved, cervix swabbed x 3 with acetic acid solution.  Following biopsies there was some bleeding which was flushed with saline and monsels solution was applied. Hemostasis was achieved.  Specimens: ECC, biopsies at 11, 12 and 6 o'clock  Complications: none.     Plan    Specimens labelled and sent to Pathology. Call patient with pathology results in 1-2 weeks, follow-up depending on results       Beverely Lowdamo, Barnaby Rippeon 12/17/2014, 11:49 AM

## 2014-12-17 NOTE — Patient Instructions (Signed)
COLPOSCOPY POST-PROCEDURE INSTRUCTIONS  1. You may take Ibuprofen, Aleve or Tylenol for cramping if needed.  2. If Monsel's solution was used, you will have a black discharge.  3. Light bleeding is normal.  If bleeding is heavier than your period, please call.  4. Put nothing in your vagina until the bleeding or discharge stops (usually 2 or3 days).  5. We will call you within one week with biopsy results or discuss the results at your follow-up appointment if needed. 6.  7. You will need at least a repeat pap smear in 6 months but depending on your results may need other treatment.

## 2014-12-18 ENCOUNTER — Other Ambulatory Visit: Payer: Self-pay | Admitting: Family Medicine

## 2014-12-22 ENCOUNTER — Encounter: Payer: Self-pay | Admitting: Obstetrics and Gynecology

## 2014-12-25 ENCOUNTER — Telehealth: Payer: Self-pay | Admitting: Family Medicine

## 2014-12-25 DIAGNOSIS — D069 Carcinoma in situ of cervix, unspecified: Secondary | ICD-10-CM

## 2014-12-25 NOTE — Telephone Encounter (Signed)
I discussed cervical biopsy result with patient. + CIN II/III. Treatment option discussed and I recommended Gyn referral for Ablation. She agreed with plan. Patient referred.

## 2014-12-29 ENCOUNTER — Telehealth: Payer: Self-pay | Admitting: Student

## 2014-12-29 NOTE — Telephone Encounter (Signed)
RPR, HIV and GC/Ct collected for history of unprotected intercourse with a female partner and female partners

## 2015-01-11 ENCOUNTER — Encounter: Payer: Self-pay | Admitting: Obstetrics and Gynecology

## 2015-01-11 ENCOUNTER — Other Ambulatory Visit (HOSPITAL_COMMUNITY)
Admission: RE | Admit: 2015-01-11 | Discharge: 2015-01-11 | Disposition: A | Discharge status: Home / Self Care | Payer: Medicaid Other | Source: Ambulatory Visit | Attending: Obstetrics and Gynecology | Admitting: Obstetrics and Gynecology

## 2015-01-11 ENCOUNTER — Ambulatory Visit (INDEPENDENT_AMBULATORY_CARE_PROVIDER_SITE_OTHER): Payer: Self-pay | Admitting: Obstetrics and Gynecology

## 2015-01-11 VITALS — BP 116/61 | HR 88 | Temp 99.2°F | Ht 69.0 in | Wt 129.3 lb

## 2015-01-11 DIAGNOSIS — Z01812 Encounter for preprocedural laboratory examination: Secondary | ICD-10-CM

## 2015-01-11 DIAGNOSIS — R87613 High grade squamous intraepithelial lesion on cytologic smear of cervix (HGSIL): Secondary | ICD-10-CM

## 2015-01-11 DIAGNOSIS — Z3202 Encounter for pregnancy test, result negative: Secondary | ICD-10-CM

## 2015-01-11 DIAGNOSIS — N8320 Unspecified ovarian cysts: Secondary | ICD-10-CM

## 2015-01-11 DIAGNOSIS — R87619 Unspecified abnormal cytological findings in specimens from cervix uteri: Secondary | ICD-10-CM | POA: Insufficient documentation

## 2015-01-11 DIAGNOSIS — N83201 Unspecified ovarian cyst, right side: Secondary | ICD-10-CM

## 2015-01-11 LAB — POCT PREGNANCY, URINE: Preg Test, Ur: NEGATIVE

## 2015-01-11 NOTE — Progress Notes (Signed)
Patient ID: Whitney AngerJazmine S Mccully, female   DOB: Apr 24, 1991, 24 y.o.   MRN: [id number] Patient identified, informed consent obtained, signed copy in chart, time out performed.  Pap smear and colposcopy reviewed.   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  Cervix visualized. Paracervical block placed.  Medium size LOOP used to remove cone of cervix using blend of cut and cautery on LEEP machine.  Edges/Base cauterized with Ball.  Monsel's solution used for hemostasis.  Patient tolerated procedure well.  Patient given post procedure instructions.  Follow up in 6 months for repeat pap or as needed. Discussed receiving the gardasil vaccine. Patient will verify if she received immunization Patient with right ovarian cyst noted on 12/08/2014 ultrasound. Will schedule follow up ultrasound in mid July. She reports some occasional right sided pain controlled with ibuprofen since February 2016 Patient will be contacted with any abnormal results with appropriate management

## 2015-01-18 ENCOUNTER — Telehealth: Payer: Self-pay | Admitting: General Practice

## 2015-01-18 NOTE — Telephone Encounter (Signed)
Called patient in regards to colpo results and need for follow up pap in 6 months. Informed patient of results and recommendations. Patient verbalized understanding and asked if she still needs to come next month. Told patient yes because the ultrasound is for follow up of her ovarian cyst which is something different. Patient verbalized understanding and had no other questions

## 2015-02-22 ENCOUNTER — Encounter (HOSPITAL_COMMUNITY): Payer: Self-pay

## 2015-02-22 ENCOUNTER — Ambulatory Visit (HOSPITAL_COMMUNITY)
Admission: RE | Admit: 2015-02-22 | Discharge: 2015-02-22 | Disposition: A | Discharge status: Home / Self Care | Payer: Self-pay | Source: Ambulatory Visit | Attending: Obstetrics and Gynecology | Admitting: Obstetrics and Gynecology

## 2015-02-22 DIAGNOSIS — N8320 Unspecified ovarian cysts: Secondary | ICD-10-CM | POA: Insufficient documentation

## 2015-02-22 DIAGNOSIS — N83201 Unspecified ovarian cyst, right side: Secondary | ICD-10-CM

## 2015-03-02 ENCOUNTER — Telehealth: Payer: Self-pay | Admitting: General Practice

## 2015-03-02 NOTE — Telephone Encounter (Signed)
Called patient regarding ultrasound showing persistent right ovary cyst. Informed patient of results and offered appointment to come back and discuss management. Patient verbalized understanding and states yes she would like. Told patient to expect call from front office regarding setting an appt up. Patient verbalized understanding and had no questions

## 2015-03-26 ENCOUNTER — Encounter: Payer: Self-pay | Admitting: Obstetrics and Gynecology

## 2015-03-26 ENCOUNTER — Ambulatory Visit (INDEPENDENT_AMBULATORY_CARE_PROVIDER_SITE_OTHER): Payer: Self-pay | Admitting: Obstetrics and Gynecology

## 2015-03-26 VITALS — BP 121/65 | HR 79 | Temp 98.2°F | Ht 66.0 in | Wt 129.7 lb

## 2015-03-26 DIAGNOSIS — N83201 Unspecified ovarian cyst, right side: Secondary | ICD-10-CM

## 2015-03-26 DIAGNOSIS — N8320 Unspecified ovarian cysts: Secondary | ICD-10-CM

## 2015-03-26 NOTE — Progress Notes (Signed)
Patient ID: Whitney Carter, female   DOB: 1991/01/08, 24 y.o.   MRN: [id number] 24 yo presenting today to discuss results of ultrasound and further management of her right ovarian cyst. Patient reports persistent abdominal pain which is worst with her menses. Her pain is controlled with ibuprofen and tylenol and is mainly located in her right lower quadrant. She denies and nausea/emesis.  Past Medical History  Diagnosis Date  . Dislocation of right shoulder joint   . Scaphoid fracture, wrist, closed     right   No past surgical history on file. Family History  Problem Relation Age of Onset  . Hypertension Mother   . Hypertension Maternal Grandmother   . Diabetes Maternal Grandmother   . Hyperlipidemia Maternal Grandmother    Social History  Substance Use Topics  . Smoking status: Never Smoker   . Smokeless tobacco: None  . Alcohol Use: Yes   ROS See pertinent in HPI  GENERAL: Well-developed, well-nourished female in no acute distress.  ABDOMEN: Soft, nontender, nondistended. PELVIC: Normal external female genitalia. Vagina is pink and rugated.  Normal discharge. Normal appearing cervix. Uterus is normal in size. Fullness in right adnexal region with tenderness to palpation EXTREMITIES: No cyanosis, clubbing, or edema, 2+ distal pulses.  02/22/2015 ultrasound FINDINGS: Uterus  Measurements: 7.2 x 3.8 x 5.4 cm. No fibroids or other mass visualized.  Endometrium  Thickness: 5 mm. No focal abnormality visualized.  Right ovary  Measurements: 9.6 x 8.7 x 8.3 cm. A complex hypoechoic lesion with diffuse low-level internal echoes and increased through transmission is again seen measuring approximately 8.4 x 8.0 x 7.1 cm. This has probably benign characteristics, highly suspicious for endometrioma.  Left ovary  Measurements: Not directly visualized by transvaginal sonography, however no adnexal mass identified.  Other findings: Tiny amount of simple free fluid  noted.  IMPRESSION: Persistent 8 cm hypoechoic right adnexal mass, highly suspicious for endometrioma. Consider pelvic MRI without and with contrast for further characterization versus surgical evaluation. This recommendation follows the consensus statement: Management of Asymptomatic Ovarian and Other Adnexal Cysts Imaged at [place]: Society of Radiologists in Ultrasound Consensus Conference Statement. Radiology 2010; [phone].  A/P 24 yo with persistent 8 cm right complex adnexal mass - Discussed surgical management with laparoscopic right ovarian cystectomy, possible right oophorectomy. Risks, benefits and alternatives were explained, including but not limited to risks of bleeding, infection and damage to adjacent organs. Patient verbalized understanding and all questions were answered. Patient will be scheduled for discussed surgery

## 2015-03-30 ENCOUNTER — Encounter (HOSPITAL_COMMUNITY): Payer: Self-pay | Admitting: *Deleted

## 2015-05-10 ENCOUNTER — Encounter: Payer: Self-pay | Admitting: General Practice

## 2015-05-17 ENCOUNTER — Encounter (HOSPITAL_COMMUNITY): Payer: Self-pay | Admitting: Anesthesiology

## 2015-05-17 NOTE — Anesthesia Preprocedure Evaluation (Addendum)
Anesthesia Evaluation  Patient identified by MRN, date of birth, ID band Patient awake    Reviewed: Allergy & Precautions, NPO status , Patient's Chart, lab work & pertinent test results  Airway Mallampati: I  TM Distance: >3 FB Neck ROM: Full    Dental  (+) Teeth Intact   Pulmonary neg pulmonary ROS,    Pulmonary exam normal breath sounds clear to auscultation       Cardiovascular negative cardio ROS Normal cardiovascular exam Rhythm:Regular Rate:Normal     Neuro/Psych negative neurological ROS  negative psych ROS   GI/Hepatic negative GI ROS, Neg liver ROS,   Endo/Other  negative endocrine ROS  Renal/GU negative Renal ROS  negative genitourinary   Musculoskeletal negative musculoskeletal ROS (+)   Abdominal   Peds  Hematology negative hematology ROS (+)   Anesthesia Other Findings   Reproductive/Obstetrics Right Ovarian cyst                             Anesthesia Physical Anesthesia Plan  ASA: I  Anesthesia Plan: General   Post-op Pain Management:    Induction: Intravenous  Airway Management Planned: Oral ETT  Additional Equipment:   Intra-op Plan:   Post-operative Plan: Extubation in OR  Informed Consent: I have reviewed the patients History and Physical, chart, labs and discussed the procedure including the risks, benefits and alternatives for the proposed anesthesia with the patient or authorized representative who has indicated his/her understanding and acceptance.   Dental advisory given  Plan Discussed with: Anesthesiologist, CRNA and Surgeon  Anesthesia Plan Comments:         Anesthesia Quick Evaluation

## 2015-05-17 NOTE — H&P (Signed)
Whitney Carter is an 24 y.o. female with an 8 cm ovarian cyst here for scheduled surgical management. Patient reports persistent abdominal pain since the diagnosis of her cyst in May 2016. She has been managing her pain with ibuprofen.   Pertinent Gynecological History: Menses: regular every month without intermenstrual spotting Contraception: none DES exposure: denies Blood transfusions: none Sexually transmitted diseases: currently at risk Previous GYN Procedures: none     Menstrual History: No LMP recorded.    Past Medical History  Diagnosis Date  . Dislocation of right shoulder joint   . Scaphoid fracture, wrist, closed     right    History reviewed. No pertinent past surgical history.  Family History  Problem Relation Age of Onset  . Hypertension Mother   . Hypertension Maternal Grandmother   . Diabetes Maternal Grandmother   . Hyperlipidemia Maternal Grandmother     Social History:  reports that she has never smoked. She does not have any smokeless tobacco history on file. She reports that she drinks alcohol. She reports that she does not use illicit drugs.  Allergies:  Allergies  Allergen Reactions  . Bee Venom Swelling    Prescriptions prior to admission  Medication Sig Dispense Refill Last Dose  . ibuprofen (ADVIL,MOTRIN) 200 MG tablet Take 400-600 mg by mouth every 6 (six) hours as needed for moderate pain (cramps).   Past Month at Unknown time    ROS See pertinent in HPI Blood pressure 107/67, pulse 74, temperature 98.2 F (36.8 C), temperature source Oral, resp. rate 16, height  (1.753 m), weight 129 lb (58.514 kg), SpO2 100 %. Physical Exam GENERAL: Well-developed, well-nourished female in no acute distress.  HEENT: Normocephalic, atraumatic. Sclerae anicteric.  NECK: Supple. Normal thyroid.  LUNGS: Clear to auscultation bilaterally.  HEART: Regular rate and rhythm. ABDOMEN: Soft, nontender, nondistended. No organomegaly. PELVIC: Deferred to  OR EXTREMITIES: No cyanosis, clubbing, or edema, 2+ distal pulses.  Results for orders placed or performed during the hospital encounter of 05/18/15 (from the past 24 hour(s))  CBC     Status: Abnormal   Collection Time: 05/18/15  6:30 AM  Result Value Ref Range   WBC 5.9 4.0 - 10.5 K/uL   RBC 3.72 (L) 3.87 - 5.11 MIL/uL   Hemoglobin 11.4 (L) 12.0 - 15.0 g/dL   HCT 16.1 (L) 09.6 - 04.5 %   MCV 91.4 78.0 - 100.0 fL   MCH 30.6 26.0 - 34.0 pg   MCHC 33.5 30.0 - 36.0 g/dL   RDW 40.9 81.1 - 91.4 %   Platelets 228 150 - 400 K/uL    No results found. 02/22/2015 ultrasound FINDINGS: Uterus  Measurements: 7.2 x 3.8 x 5.4 cm. No fibroids or other mass visualized.  Endometrium  Thickness: 5 mm. No focal abnormality visualized.  Right ovary  Measurements: 9.6 x 8.7 x 8.3 cm. A complex hypoechoic lesion with diffuse low-level internal echoes and increased through transmission is again seen measuring approximately 8.4 x 8.0 x 7.1 cm. This has probably benign characteristics, highly suspicious for endometrioma.  Left ovary  Measurements: Not directly visualized by transvaginal sonography, however no adnexal mass identified.  Other findings: Tiny amount of simple free fluid noted.  IMPRESSION: Persistent 8 cm hypoechoic right adnexal mass, highly suspicious for endometrioma. Consider pelvic MRI without and with contrast for further characterization versus surgical evaluation. This recommendation follows the consensus statement: Management of Asymptomatic Ovarian and Other Adnexal Cysts Imaged at [place]: Society of Radiologists in Ultrasound Consensus Conference  Statement. Radiology 2010; [phone].   Assessment/Plan: 24 yo with persistent 8 cm right ovarian cyst here for a laparoscopic ovarian cystectomy -Risks, benefits and alternatives were explained including but not limited to risks of bleeding, infection and damage to adjacent organs. Patient also understands the  possibility of laparotomy and possible oophorectomy.  Reed Eifert 05/18/2015, 6:51 AM

## 2015-05-18 ENCOUNTER — Ambulatory Visit (HOSPITAL_COMMUNITY): Payer: Medicaid Other | Admitting: Anesthesiology

## 2015-05-18 ENCOUNTER — Ambulatory Visit (HOSPITAL_COMMUNITY): Payer: Self-pay | Admitting: Anesthesiology

## 2015-05-18 ENCOUNTER — Ambulatory Visit (HOSPITAL_COMMUNITY)
Admission: RE | Admit: 2015-05-18 | Discharge: 2015-05-18 | Disposition: A | Discharge status: Home / Self Care | Payer: Self-pay | Source: Ambulatory Visit | Attending: Obstetrics and Gynecology | Admitting: Obstetrics and Gynecology

## 2015-05-18 ENCOUNTER — Encounter (HOSPITAL_COMMUNITY)
Admission: RE | Disposition: A | Discharge status: Home / Self Care | Payer: Self-pay | Source: Ambulatory Visit | Attending: Obstetrics and Gynecology

## 2015-05-18 ENCOUNTER — Encounter (HOSPITAL_COMMUNITY): Payer: Self-pay

## 2015-05-18 DIAGNOSIS — N83201 Unspecified ovarian cyst, right side: Secondary | ICD-10-CM | POA: Insufficient documentation

## 2015-05-18 HISTORY — PX: LAPAROSCOPIC OVARIAN CYSTECTOMY: SHX6248

## 2015-05-18 LAB — CBC
HEMATOCRIT: 34 % — AB (ref 36.0–46.0)
HEMOGLOBIN: 11.4 g/dL — AB (ref 12.0–15.0)
MCH: 30.6 pg (ref 26.0–34.0)
MCHC: 33.5 g/dL (ref 30.0–36.0)
MCV: 91.4 fL (ref 78.0–100.0)
Platelets: 228 10*3/uL (ref 150–400)
RBC: 3.72 MIL/uL — ABNORMAL LOW (ref 3.87–5.11)
RDW: 12.3 % (ref 11.5–15.5)
WBC: 5.9 10*3/uL (ref 4.0–10.5)

## 2015-05-18 LAB — TYPE AND SCREEN
ABO/RH(D): B POS
Antibody Screen: NEGATIVE

## 2015-05-18 LAB — ABO/RH: ABO/RH(D): B POS

## 2015-05-18 LAB — PREGNANCY, URINE: Preg Test, Ur: NEGATIVE

## 2015-05-18 SURGERY — EXCISION, CYST, OVARY, LAPAROSCOPIC
Anesthesia: General | Site: Abdomen | Laterality: Right

## 2015-05-18 MED ORDER — LACTATED RINGERS IR SOLN
Status: DC | PRN
  Administered 2015-05-18: 3000 mL

## 2015-05-18 MED ORDER — FENTANYL CITRATE (PF) 100 MCG/2ML IJ SOLN
INTRAMUSCULAR | Status: DC | PRN
  Administered 2015-05-18 (×2): 50 ug via INTRAVENOUS
  Administered 2015-05-18: 100 ug via INTRAVENOUS

## 2015-05-18 MED ORDER — OXYCODONE-ACETAMINOPHEN 5-325 MG PO TABS
1.0000 | ORAL_TABLET | Freq: Once | ORAL | Status: AC
  Administered 2015-05-18: 1 via ORAL

## 2015-05-18 MED ORDER — ONDANSETRON HCL 4 MG/2ML IJ SOLN
INTRAMUSCULAR | Status: AC
  Filled 2015-05-18: qty 2

## 2015-05-18 MED ORDER — FENTANYL CITRATE (PF) 100 MCG/2ML IJ SOLN
INTRAMUSCULAR | Status: AC
  Administered 2015-05-18: 25 ug
  Filled 2015-05-18: qty 2

## 2015-05-18 MED ORDER — KETOROLAC TROMETHAMINE 30 MG/ML IJ SOLN
INTRAMUSCULAR | Status: AC
  Filled 2015-05-18: qty 1

## 2015-05-18 MED ORDER — FENTANYL CITRATE (PF) 100 MCG/2ML IJ SOLN
25.0000 ug | INTRAMUSCULAR | Status: DC | PRN
  Administered 2015-05-18: 50 ug via INTRAVENOUS

## 2015-05-18 MED ORDER — FENTANYL CITRATE (PF) 250 MCG/5ML IJ SOLN
INTRAMUSCULAR | Status: AC
  Filled 2015-05-18: qty 25

## 2015-05-18 MED ORDER — DEXAMETHASONE SODIUM PHOSPHATE 4 MG/ML IJ SOLN
INTRAMUSCULAR | Status: AC
  Filled 2015-05-18: qty 1

## 2015-05-18 MED ORDER — ONDANSETRON HCL 4 MG/2ML IJ SOLN
INTRAMUSCULAR | Status: DC | PRN
  Administered 2015-05-18: 4 mg via INTRAVENOUS

## 2015-05-18 MED ORDER — LACTATED RINGERS IV SOLN
INTRAVENOUS | Status: DC
  Administered 2015-05-18: 08:00:00 via INTRAVENOUS
  Administered 2015-05-18: 10 mL/h via INTRAVENOUS

## 2015-05-18 MED ORDER — NEOSTIGMINE METHYLSULFATE 10 MG/10ML IV SOLN
INTRAVENOUS | Status: AC
  Filled 2015-05-18: qty 1

## 2015-05-18 MED ORDER — GLYCOPYRROLATE 0.2 MG/ML IJ SOLN
INTRAMUSCULAR | Status: DC | PRN
  Administered 2015-05-18: .5 mg via INTRAVENOUS

## 2015-05-18 MED ORDER — BUPIVACAINE HCL (PF) 0.25 % IJ SOLN
INTRAMUSCULAR | Status: DC | PRN
  Administered 2015-05-18: 10 mL

## 2015-05-18 MED ORDER — KETOROLAC TROMETHAMINE 30 MG/ML IJ SOLN
INTRAMUSCULAR | Status: DC | PRN
  Administered 2015-05-18: 30 mg via INTRAVENOUS

## 2015-05-18 MED ORDER — NEOSTIGMINE METHYLSULFATE 10 MG/10ML IV SOLN
INTRAVENOUS | Status: DC | PRN
  Administered 2015-05-18: 3 mg via INTRAVENOUS

## 2015-05-18 MED ORDER — MIDAZOLAM HCL 5 MG/5ML IJ SOLN
INTRAMUSCULAR | Status: DC | PRN
  Administered 2015-05-18: 2 mg via INTRAVENOUS

## 2015-05-18 MED ORDER — LIDOCAINE HCL (CARDIAC) 20 MG/ML IV SOLN
INTRAVENOUS | Status: DC | PRN
  Administered 2015-05-18: 50 mg via INTRAVENOUS

## 2015-05-18 MED ORDER — DEXAMETHASONE SODIUM PHOSPHATE 4 MG/ML IJ SOLN
INTRAMUSCULAR | Status: DC | PRN
  Administered 2015-05-18: 4 mg via INTRAVENOUS

## 2015-05-18 MED ORDER — SCOPOLAMINE 1 MG/3DAYS TD PT72
1.0000 | MEDICATED_PATCH | Freq: Once | TRANSDERMAL | Status: DC
  Administered 2015-05-18: 1.5 mg via TRANSDERMAL

## 2015-05-18 MED ORDER — HYDROCODONE-ACETAMINOPHEN 7.5-325 MG PO TABS: 1.0000 | ORAL_TABLET | Freq: Once | ORAL | Status: DC | PRN

## 2015-05-18 MED ORDER — SUCCINYLCHOLINE CHLORIDE 20 MG/ML IJ SOLN
INTRAMUSCULAR | Status: AC
  Filled 2015-05-18: qty 1

## 2015-05-18 MED ORDER — SCOPOLAMINE 1 MG/3DAYS TD PT72
MEDICATED_PATCH | TRANSDERMAL | Status: AC
  Filled 2015-05-18: qty 1

## 2015-05-18 MED ORDER — PROPOFOL 10 MG/ML IV BOLUS
INTRAVENOUS | Status: DC | PRN
  Administered 2015-05-18: 150 mg via INTRAVENOUS

## 2015-05-18 MED ORDER — OXYCODONE-ACETAMINOPHEN 5-325 MG PO TABS
ORAL_TABLET | ORAL | Status: AC
  Filled 2015-05-18: qty 1

## 2015-05-18 MED ORDER — MIDAZOLAM HCL 2 MG/2ML IJ SOLN
INTRAMUSCULAR | Status: AC
  Filled 2015-05-18: qty 4

## 2015-05-18 MED ORDER — BUPIVACAINE HCL (PF) 0.25 % IJ SOLN
INTRAMUSCULAR | Status: AC
  Filled 2015-05-18: qty 30

## 2015-05-18 MED ORDER — PROPOFOL 10 MG/ML IV BOLUS
INTRAVENOUS | Status: AC
  Filled 2015-05-18: qty 20

## 2015-05-18 MED ORDER — LIDOCAINE HCL (CARDIAC) 20 MG/ML IV SOLN
INTRAVENOUS | Status: AC
  Filled 2015-05-18: qty 5

## 2015-05-18 MED ORDER — ROCURONIUM BROMIDE 100 MG/10ML IV SOLN
INTRAVENOUS | Status: DC | PRN
  Administered 2015-05-18: 40 mg via INTRAVENOUS

## 2015-05-18 MED ORDER — GLYCOPYRROLATE 0.2 MG/ML IJ SOLN
INTRAMUSCULAR | Status: AC
  Filled 2015-05-18: qty 2

## 2015-05-18 MED ORDER — MEPERIDINE HCL 25 MG/ML IJ SOLN: 6.2500 mg | INTRAMUSCULAR | Status: DC | PRN

## 2015-05-18 MED ORDER — METOCLOPRAMIDE HCL 5 MG/ML IJ SOLN
10.0000 mg | Freq: Once | INTRAMUSCULAR | Status: DC | PRN
Start: 2015-05-18 — End: 2015-05-18

## 2015-05-18 MED ORDER — OXYCODONE-ACETAMINOPHEN 5-325 MG PO TABS: 1.0000 | ORAL_TABLET | ORAL | Status: DC | PRN

## 2015-05-18 MED ORDER — DOCUSATE SODIUM 100 MG PO CAPS: 100.0000 mg | ORAL_CAPSULE | Freq: Two times a day (BID) | ORAL | Status: DC | PRN

## 2015-05-18 MED ORDER — IBUPROFEN 600 MG PO TABS: 600.0000 mg | ORAL_TABLET | Freq: Four times a day (QID) | ORAL | Status: DC | PRN

## 2015-05-18 MED ORDER — ROCURONIUM BROMIDE 100 MG/10ML IV SOLN
INTRAVENOUS | Status: AC
  Filled 2015-05-18: qty 1

## 2015-05-18 SURGICAL SUPPLY — 31 items
BAG SPEC RTRVL LRG 6X4 10 (ENDOMECHANICALS) ×1
CHLORAPREP W/TINT 26ML (MISCELLANEOUS) ×2 IMPLANT
DRSG COVADERM PLUS 2X2 (GAUZE/BANDAGES/DRESSINGS) ×4 IMPLANT
DRSG OPSITE POSTOP 3X4 (GAUZE/BANDAGES/DRESSINGS) ×1 IMPLANT
ELECT REM PT RETURN 9FT ADLT (ELECTROSURGICAL) ×2
ELECTRODE REM PT RTRN 9FT ADLT (ELECTROSURGICAL) IMPLANT
FILTER SMOKE EVAC LAPAROSHD (FILTER) ×1 IMPLANT
GLOVE BIO SURGEON STRL SZ 6.5 (GLOVE) ×1 IMPLANT
GLOVE BIO SURGEON STRL SZ7 (GLOVE) ×1 IMPLANT
GLOVE BIOGEL PI IND STRL 6.5 (GLOVE) ×1 IMPLANT
GLOVE BIOGEL PI INDICATOR 6.5 (GLOVE) ×1
GLOVE SURG SS PI 6.0 STRL IVOR (GLOVE) ×2 IMPLANT
GOWN STRL REUS W/TWL LRG LVL3 (GOWN DISPOSABLE) ×4 IMPLANT
LIQUID BAND (GAUZE/BANDAGES/DRESSINGS) ×2 IMPLANT
NS IRRIG 1000ML POUR BTL (IV SOLUTION) ×2 IMPLANT
PACK LAPAROSCOPY BASIN (CUSTOM PROCEDURE TRAY) ×2 IMPLANT
PAD POSITIONING PINK XL (MISCELLANEOUS) ×2 IMPLANT
POUCH SPECIMEN RETRIEVAL 10MM (ENDOMECHANICALS) ×1 IMPLANT
SCISSORS LAP 5X35 DISP (ENDOMECHANICALS) ×1 IMPLANT
SEALER TISSUE G2 CVD JAW 35 (ENDOMECHANICALS) IMPLANT
SEALER TISSUE G2 CVD JAW 45CM (ENDOMECHANICALS)
SET IRRIG TUBING LAPAROSCOPIC (IRRIGATION / IRRIGATOR) ×1 IMPLANT
SHEARS HARMONIC ACE PLUS 36CM (ENDOMECHANICALS) ×1 IMPLANT
SUT MNCRL AB 4-0 PS2 18 (SUTURE) ×2 IMPLANT
SUT VICRYL 0 UR6 27IN ABS (SUTURE) ×4 IMPLANT
SYRINGE 60CC LL (MISCELLANEOUS) ×1 IMPLANT
TOWEL OR 17X24 6PK STRL BLUE (TOWEL DISPOSABLE) ×4 IMPLANT
TRAY FOLEY CATH SILVER 14FR (SET/KITS/TRAYS/PACK) ×2 IMPLANT
TROCAR BALLN 12MMX100 BLUNT (TROCAR) ×2 IMPLANT
TROCAR XCEL NON-BLD 5MMX100MML (ENDOMECHANICALS) ×2 IMPLANT
WATER STERILE IRR 1000ML POUR (IV SOLUTION) ×2 IMPLANT

## 2015-05-18 NOTE — Transfer of Care (Signed)
Immediate Anesthesia Transfer of Care Note  Patient: Whitney Carter  Procedure(s) Performed: Procedure(s) with comments: LAPAROSCOPIC OVARIAN CYSTECTOMY (Right) - Requested 05-18-15 @ 7:30a  Patient Location: PACU  Anesthesia Type:General  Level of Consciousness: awake, alert  and oriented  Airway & Oxygen Therapy: Patient Spontanous Breathing and Patient connected to nasal cannula oxygen  Post-op Assessment: Report given to RN and Post -op Vital signs reviewed and stable  Post vital signs: Reviewed and stable  Last Vitals:  Filed Vitals:   05/18/15 0631  BP: 107/67  Pulse: 74  Temp: 36.8 C  Resp: 16    Complications: No apparent anesthesia complications

## 2015-05-18 NOTE — Anesthesia Postprocedure Evaluation (Signed)
  Anesthesia Post-op Note  Patient: Whitney Carter  Procedure(s) Performed: Procedure(s) with comments: LAPAROSCOPIC OVARIAN CYSTECTOMY (Right) - Requested 05-18-15 @ 7:30a  Patient Location: PACU  Anesthesia Type:General  Level of Consciousness: awake, alert  and oriented  Airway and Oxygen Therapy: Patient Spontanous Breathing  Post-op Pain: mild  Post-op Assessment: Post-op Vital signs reviewed, Patient's Cardiovascular Status Stable, Respiratory Function Stable, Patent Airway, No signs of Nausea or vomiting and Pain level controlled              Post-op Vital Signs: Reviewed and stable  Last Vitals:  Filed Vitals:   05/18/15 0915  BP: 102/56  Pulse: 60  Temp:   Resp: 12    Complications: No apparent anesthesia complications

## 2015-05-18 NOTE — Anesthesia Procedure Notes (Signed)
Procedure Name: Intubation Date/Time: 05/18/2015 7:18 AM Performed by: Jhonnie Garner Pre-anesthesia Checklist: Patient identified, Emergency Drugs available, Suction available and Patient being monitored Patient Re-evaluated:Patient Re-evaluated prior to inductionOxygen Delivery Method: Circle system utilized Preoxygenation: Pre-oxygenation with 100% oxygen Intubation Type: IV induction Ventilation: Mask ventilation without difficulty Laryngoscope Size: Miller and 2 Grade View: Grade I Tube type: Oral Tube size: 7.0 mm Number of attempts: 1 Airway Equipment and Method: Stylet Placement Confirmation: ETT inserted through vocal cords under direct vision,  positive ETCO2 and breath sounds checked- equal and bilateral Secured at: 21 cm Tube secured with: Tape Dental Injury: Teeth and Oropharynx as per pre-operative assessment

## 2015-05-18 NOTE — Discharge Instructions (Signed)
Diagnostic Laparoscopy °A diagnostic laparoscopy is a procedure to diagnose diseases in the abdomen. During the procedure, a thin, lighted, pencil-sized instrument called a laparoscope is inserted into the abdomen through an incision. The laparoscope allows your health care provider to look at the organs inside your body. °LET YOUR HEALTH CARE PROVIDER KNOW ABOUT: °· Any allergies you have. °· All medicines you are taking, including vitamins, herbs, eye drops, creams, and over-the-counter medicines. °· Previous problems you or members of your family have had with the use of anesthetics. °· Any blood disorders you have. °· Previous surgeries you have had. °· Medical conditions you have. °RISKS AND COMPLICATIONS  °Generally, this is a safe procedure. However, problems can occur, which may include: °· Infection. °· Bleeding. °· Damage to other organs. °· Allergic reaction to the anesthetics used during the procedure. °BEFORE THE PROCEDURE °· Do not eat or drink anything after midnight on the night before the procedure or as directed by your health care provider. °· Ask your health care provider about: °¨ Changing or stopping your regular medicines. °¨ Taking medicines such as aspirin and ibuprofen. These medicines can thin your blood. Do not take these medicines before your procedure if your health care provider instructs you not to. °· Plan to have someone take you home after the procedure. °PROCEDURE °· You may be given a medicine to help you relax (sedative). °· You will be given a medicine to make you sleep (general anesthetic). °· Your abdomen will be inflated with a gas. This will make your organs easier to see. °· Small incisions will be made in your abdomen. °· A laparoscope and other small instruments will be inserted into the abdomen through the incisions. °· A tissue sample may be removed from an organ in the abdomen for examination. °· The instruments will be removed from the abdomen. °· The gas will be  released. °· The incisions will be closed with stitches (sutures). °AFTER THE PROCEDURE  °Your blood pressure, heart rate, breathing rate, and blood oxygen level will be monitored often until the medicines you were given have worn off. °  °This information is not intended to replace advice given to you by your health care provider. Make sure you discuss any questions you have with your health care provider. °  °Document Released: 10/30/2000 Document Revised: 04/14/2015 Document Reviewed: 03/06/2014 °[employer]vier Interactive Patient Education ©2016 [employer] ° ° ° °DISCHARGE INSTRUCTIONS: Laparoscopy ° °The following instructions have been prepared to help you care for yourself upon your return home today. ° °Wound care: °• Do not get the incision wet for the first 24 hours. The incision should be kept clean and dry. °• The Band-Aids or dressings may be removed the day after surgery. °• Should the incision become sore, red, and swollen after the first week, check with your doctor. ° °Personal hygiene: °• Shower the day after your procedure. ° °Activity and limitations: °• Do NOT drive or operate any equipment today. °• Do NOT lift anything more than 15 pounds for 2-3 weeks after surgery. °• Do NOT rest in bed all day. °• Walking is encouraged. Walk each day, starting slowly with 5-minute walks 3 or 4 times a day. Slowly increase the length of your walks. °• Walk up and down stairs slowly. °• Do NOT do strenuous activities, such as golfing, playing tennis, bowling, running, biking, weight lifting, gardening, mowing, or vacuuming for 2-4 weeks. Ask your doctor when it is okay to start. ° °Diet: Eat a   light meal as desired this evening. You may resume your usual diet tomorrow. ° °Return to work: This is dependent on the type of work you do. For the most part you can return to a desk job within a week of surgery. If you are more active at work, please discuss this with your doctor. ° °What to expect after your surgery:  You may have a slight burning sensation when you urinate on the first day. You may have a very small amount of blood in the urine. Expect to have a small amount of vaginal discharge/light bleeding for 1-2 weeks. It is not unusual to have abdominal soreness and bruising for up to 2 weeks. You may be tired and need more rest for about 1 week. You may experience shoulder pain for 24-72 hours. Lying flat in bed may relieve it. ° °Call your doctor for any of the following: °• Develop a fever of 100.4 or greater °• Inability to urinate 6 hours after discharge from hospital °• Severe pain not relieved by pain medications °• Persistent of heavy bleeding at incision site °• Redness or swelling around incision site after a week °• Increasing nausea or vomiting ° °Patient Signature________________________________________ °Nurse Signature_________________________________________ ° ° ° °Post Anesthesia Home Care Instructions ° °Activity: °Get plenty of rest for the remainder of the day. A responsible adult should stay with you for 24 hours following the procedure.  °For the next 24 hours, DO NOT: °-Drive a car °-Operate machinery °-Drink alcoholic beverages °-Take any medication unless instructed by your physician °-Make any legal decisions or sign important papers. ° °Meals: °Start with liquid foods such as gelatin or soup. Progress to regular foods as tolerated. Avoid greasy, spicy, heavy foods. If nausea and/or vomiting occur, drink only clear liquids until the nausea and/or vomiting subsides. Call your physician if vomiting continues. ° °Special Instructions/Symptoms: °Your throat may feel dry or sore from the anesthesia or the breathing tube placed in your throat during surgery. If this causes discomfort, gargle with warm salt water. The discomfort should disappear within 24 hours. ° °If you had a scopolamine patch placed behind your ear for the management of post- operative nausea and/or vomiting: ° °1. The medication in  the patch is effective for 72 hours, after which it should be removed.  Wrap patch in a tissue and discard in the trash. Wash hands thoroughly with soap and water. °2. You may remove the patch earlier than 72 hours if you experience unpleasant side effects which may include dry mouth, dizziness or visual disturbances. °3. Avoid touching the patch. Wash your hands with soap and water after contact with the patch. °  ° °

## 2015-05-18 NOTE — Op Note (Signed)
PROCEDURE DATE: 05/11/2015  PREOPERATIVE DIAGNOSES: Persistent right ovarian cyst POSTOPERATIVE DIAGNOSES: The same PROCEDURE: Laparoscopic right ovarian cystectomy SURGEON:  Dr. Catalina Antigua ASSISTANT: Dr. Scheryl Darter   INDICATIONS: 24 y.o. G1P1001 with persistent right ovarian cyst appearing to be an endometrioma on ultrasound here today for definitive surgical management.   Risks of surgery were discussed with the patient including but not limited to: bleeding which may require transfusion or reoperation; infection which may require antibiotics; injury to bowel, bladder, ureters or other surrounding organs; need for additional procedures including laparotomy; thromboembolic phenomenon, incisional problems and other postoperative/anesthesia complications. Written informed consent was obtained.    FINDINGS:  Small uterus, right adnexa with 8 cm right ovarian cyst. Normal left adnexa. No evidence of endometriosis, adhesions or any other abdominal/pelvic abnormality.  Normal upper abdomen.  ANESTHESIA:    General INTRAVENOUS FLUIDS: 1200 ml ESTIMATED BLOOD LOSS: 50 ml SPECIMENS:  Right ovarian cysts COMPLICATIONS: None immediate   PROCEDURE IN DETAIL:  The patient was taken to the operating room where general anesthesia was administered and was found to be adequate.  She was placed in the dorsal lithotomy position, and was prepped and draped in a sterile manner.  A Foley catheter was inserted into her bladder and attached to Whitney Carter drainage and a uterine manipulator was then advanced into the uterus .  After an adequate timeout was performed, attention was then turned to the patient's abdomen where a 11-mm skin incision was made in the umbilical fold.  The fascia was identified, grasped with Kocher clamps, incised and tagged with 0-Vicryl.  The 11-mm trocar and sleeve were then advanced without difficulty into the abdomen without difficulty and intraabdominal placement was confirmed by the  laparoscope. A survey of the patient's pelvis and abdomen revealed the findings above.   Bilateral 5-mm lower quadrant ports  were then placed under direct visualization. Attention was turned to the right ovary which was mobilized out of the pelvis. An approximately 5 to 6 cm incision was made along the ovarian cortex using the Endo shears attached to electrocautery. The cyst was drained using a suction aspirator revealing a chocolate-like content. The cyst was carefully dissected using the endo shears and blunt dissection with countertractions. The ovarian bed was then irrigated and hemostasis was obtained using electrocautery. The cyst was placed in the posterior cul de sac.  The Endo Catch bag was placed through the 10 mm port and the cyst was placed in the Endo Catch bag and removed through the 10 mm incision without difficulty. At this time, the pelvis was again copiously irrigated. Hemostasis was assured. No intraoperative injury to other surrounding organs was noted.  The abdomen was desufflated and all instruments were then removed from the patient's abdomen. The fascial incision of the umbilicus was closed with a 0 Vicryl figure of eight stitch.  All skin incisions were closed with 3-0 Vicryl subcuticular stitches/Dermabond.   The patient will be discharged to home as per PACU criteria.  Routine postoperative instructions given.  She was prescribed Percocet, Ibuprofen and Colace.  She will follow up in the clinic in 2 weeks for postoperative evaluation.

## 2015-05-19 ENCOUNTER — Encounter (HOSPITAL_COMMUNITY): Payer: Self-pay | Admitting: Obstetrics and Gynecology

## 2015-06-02 ENCOUNTER — Encounter: Payer: Self-pay | Admitting: Obstetrics and Gynecology

## 2015-06-02 ENCOUNTER — Ambulatory Visit (INDEPENDENT_AMBULATORY_CARE_PROVIDER_SITE_OTHER): Payer: Self-pay | Admitting: Obstetrics and Gynecology

## 2015-06-02 VITALS — BP 106/61 | HR 73 | Temp 98.9°F | Wt 129.8 lb

## 2015-06-02 DIAGNOSIS — Z9889 Other specified postprocedural states: Secondary | ICD-10-CM

## 2015-06-02 NOTE — Progress Notes (Signed)
Patient ID: Whitney Carter, female   DOB: [birth year], 24 y.o.   MRN: [id number] 24 yo G0 here for post op check. Patient underwent a laparoscopic ovarian cystectomy on May 18, 2015 secondary to the presence of a persistent ovarian cyst. Patient reports feeling well and is without complaints. She is no longer using any pain medication. She denies any fever, chills or abnormal drainage from her incisions  Past Medical History  Diagnosis Date  . Dislocation of right shoulder joint   . Scaphoid fracture, wrist, closed     right   Past Surgical History  Procedure Laterality Date  . Laparoscopic ovarian cystectomy Right 05/18/2015    Procedure: LAPAROSCOPIC OVARIAN CYSTECTOMY;  Surgeon: Catalina AntiguaPeggy Trampus Mcquerry, MD;  Location: [facility];  Service: Gynecology;  Laterality: Right;  Requested 05-18-15 @ 7:30a   Family History  Problem Relation Age of Onset  . Hypertension Mother   . Hypertension Maternal Grandmother   . Diabetes Maternal Grandmother   . Hyperlipidemia Maternal Grandmother    Social History  Substance Use Topics  . Smoking status: Never Smoker   . Smokeless tobacco: None  . Alcohol Use: Yes   ROS See pertinent in HPI  Blood pressure 106/61, pulse 73, temperature 98.9 F (37.2 C), weight 129 lb 12.8 oz (58.877 kg). GENERAL: Well-developed, well-nourished female in no acute distress.  ABDOMEN: Soft, nontender, nondistended. No organomegaly. Incisions: all healed x 3. No erythema, induration or drainage EXTREMITIES: No cyanosis, clubbing, or edema, 2+ distal pulses.  A/P 24 yo here for post op check - patient is medically cleared to resume all activities of daily living - pathology report reviewed with the patient - RTC prn

## 2015-06-10 ENCOUNTER — Telehealth: Payer: Self-pay | Admitting: *Deleted

## 2015-06-10 NOTE — Telephone Encounter (Addendum)
Pt left message yesterday @ 1423 stating that she had recent surgery and is now having some bleeding. She wants to know if this is normal.   11/4  1210  Called pt and left message that I was returning her call. I stated that what she described does not sound worrisome. If the problem worsens or if she has severe pain, she should go to [facility] for evaluation. She may also call back with a new message if she has additional questions.

## 2015-11-09 ENCOUNTER — Encounter (HOSPITAL_COMMUNITY): Payer: Self-pay | Admitting: Emergency Medicine

## 2015-11-09 ENCOUNTER — Emergency Department (HOSPITAL_COMMUNITY)
Admission: EM | Admit: 2015-11-09 | Discharge: 2015-11-09 | Disposition: A | Discharge status: Home / Self Care | Payer: Self-pay | Attending: Emergency Medicine | Admitting: Emergency Medicine

## 2015-11-09 ENCOUNTER — Emergency Department (HOSPITAL_COMMUNITY): Payer: Self-pay

## 2015-11-09 DIAGNOSIS — R319 Hematuria, unspecified: Secondary | ICD-10-CM | POA: Insufficient documentation

## 2015-11-09 DIAGNOSIS — Z8781 Personal history of (healed) traumatic fracture: Secondary | ICD-10-CM | POA: Insufficient documentation

## 2015-11-09 DIAGNOSIS — R1031 Right lower quadrant pain: Secondary | ICD-10-CM

## 2015-11-09 DIAGNOSIS — N80129 Deep endometriosis of ovary, unspecified ovary: Secondary | ICD-10-CM

## 2015-11-09 DIAGNOSIS — Z3202 Encounter for pregnancy test, result negative: Secondary | ICD-10-CM | POA: Insufficient documentation

## 2015-11-09 DIAGNOSIS — N809 Endometriosis, unspecified: Secondary | ICD-10-CM | POA: Insufficient documentation

## 2015-11-09 DIAGNOSIS — D649 Anemia, unspecified: Secondary | ICD-10-CM | POA: Insufficient documentation

## 2015-11-09 LAB — CBC
HCT: 35.6 % — ABNORMAL LOW (ref 36.0–46.0)
Hemoglobin: 11.7 g/dL — ABNORMAL LOW (ref 12.0–15.0)
MCH: 29.7 pg (ref 26.0–34.0)
MCHC: 32.9 g/dL (ref 30.0–36.0)
MCV: 90.4 fL (ref 78.0–100.0)
Platelets: 277 10*3/uL (ref 150–400)
RBC: 3.94 MIL/uL (ref 3.87–5.11)
RDW: 12.1 % (ref 11.5–15.5)
WBC: 4.3 10*3/uL (ref 4.0–10.5)

## 2015-11-09 LAB — COMPREHENSIVE METABOLIC PANEL
ALK PHOS: 74 U/L (ref 38–126)
ALT: 11 U/L — AB (ref 14–54)
AST: 17 U/L (ref 15–41)
Albumin: 3.9 g/dL (ref 3.5–5.0)
Anion gap: 7 (ref 5–15)
BILIRUBIN TOTAL: 0.7 mg/dL (ref 0.3–1.2)
BUN: 9 mg/dL (ref 6–20)
CALCIUM: 9 mg/dL (ref 8.9–10.3)
CO2: 25 mmol/L (ref 22–32)
CREATININE: 0.71 mg/dL (ref 0.44–1.00)
Chloride: 106 mmol/L (ref 101–111)
GFR calc non Af Amer: >60 mL/min (ref >60)
Glucose, Bld: 67 mg/dL (ref 65–99)
Potassium: 3.6 mmol/L (ref 3.5–5.1)
Sodium: 138 mmol/L (ref 135–145)
TOTAL PROTEIN: 7.5 g/dL (ref 6.5–8.1)

## 2015-11-09 LAB — URINALYSIS, ROUTINE W REFLEX MICROSCOPIC
BILIRUBIN URINE: NEGATIVE
Glucose, UA: NEGATIVE mg/dL
Hgb urine dipstick: NEGATIVE
KETONES UR: NEGATIVE mg/dL
NITRITE: NEGATIVE
PROTEIN: NEGATIVE mg/dL
Specific Gravity, Urine: 1.024 (ref 1.005–1.030)
pH: 6 (ref 5.0–8.0)

## 2015-11-09 LAB — URINE MICROSCOPIC-ADD ON

## 2015-11-09 LAB — LIPASE, BLOOD: Lipase: 38 U/L (ref 11–51)

## 2015-11-09 LAB — I-STAT BETA HCG BLOOD, ED (MC, WL, AP ONLY)

## 2015-11-09 MED ORDER — IBUPROFEN 600 MG PO TABS: 600.0000 mg | ORAL_TABLET | Freq: Four times a day (QID) | ORAL | Status: DC | PRN

## 2015-11-09 MED ORDER — OXYCODONE-ACETAMINOPHEN 5-325 MG PO TABS: 2.0000 | ORAL_TABLET | ORAL | Status: DC | PRN

## 2015-11-09 MED ORDER — MORPHINE SULFATE (PF) 4 MG/ML IV SOLN
4.0000 mg | Freq: Once | INTRAVENOUS | Status: AC
  Administered 2015-11-09: 4 mg via INTRAVENOUS
  Filled 2015-11-09: qty 1

## 2015-11-09 NOTE — ED Notes (Signed)
PA at bedside.

## 2015-11-09 NOTE — ED Notes (Signed)
Patient transported to Ultrasound 

## 2015-11-09 NOTE — ED Provider Notes (Signed)
CSN: [id number]     Arrival date & time 11/09/15  1333 History  By signing my name below, I, Soijett Blue, attest that this documentation has been prepared under the direction and in the presence of Catha Gosselin, PA-C Electronically Signed: Soijett Blue, ED Scribe. 11/09/2015. 2:12 PM.   Chief Complaint  Patient presents with  . Abdominal Pain   The history is provided by the patient. No language interpreter was used.   HPI Comments: Whitney Carter is a 25 y.o. female who presents to the Emergency Department complaining of 7/10, constant, sharp/stabbing right lower abdominal pain onset yesterday. Pt reports that she went to pee yesterday and noticed some blood. Pt LMP was 10/27/15.  Pt notes that she had surgery for a cyst on the right ovary in 05/2015 and was informed that the cyst could return. Pt reports that her pain today is exactly like the pain that she had prior to surgery. Pt reports that her last BM was 2 days ago. Pt has not tried any medications to relieve her symptoms. Pt denies dysuria, vaginal discharge or bleeding, back pain, frequency, fever, nausea, vomiting, diarrhea, and any other symptoms. Denies allergies to medications.  Per pt chart review: Pt had laparoscopic right ovarian cystectomy completed on 05/18/2015 for 8 cm ovarian cyst that was possibly endometrioma.  Past Medical History  Diagnosis Date  . Dislocation of right shoulder joint   . Scaphoid fracture, wrist, closed     right   Past Surgical History  Procedure Laterality Date  . Laparoscopic ovarian cystectomy Right 05/18/2015    Procedure: LAPAROSCOPIC OVARIAN CYSTECTOMY;  Surgeon: Catalina Antigua, MD;  Location: [facility];  Service: Gynecology;  Laterality: Right;  Requested 05-18-15 @ 7:30a   Family History  Problem Relation Age of Onset  . Hypertension Mother   . Hypertension Maternal Grandmother   . Diabetes Maternal Grandmother   . Hyperlipidemia Maternal Grandmother    Social History  Substance  Use Topics  . Smoking status: Never Smoker   . Smokeless tobacco: None  . Alcohol Use: Yes   OB History    Gravida Para Term Preterm AB TAB SAB Ectopic Multiple Living       Review of Systems  Constitutional: Negative for fever.  Gastrointestinal: Positive for abdominal pain. Negative for nausea, vomiting and diarrhea.  Genitourinary: Positive for hematuria. Negative for dysuria, frequency and vaginal discharge.  Musculoskeletal: Negative for back pain.  All other systems reviewed and are negative.     Allergies  Bee venom  Home Medications   Prior to Admission medications   Medication Sig Start Date End Date Taking? Authorizing Provider  docusate sodium (COLACE) 100 MG capsule Take 1 capsule (100 mg total) by mouth 2 (two) times daily as needed. Patient not taking: Reported on 11/09/2015 05/18/15   Catalina Antigua, MD  ibuprofen (ADVIL,MOTRIN) 600 MG tablet Take 1 tablet (600 mg total) by mouth every 6 (six) hours as needed. 11/09/15   Kelwin Gibler Patel-Mills, PA-C  oxyCODONE-acetaminophen (PERCOCET/ROXICET) 5-325 MG tablet Take 2 tablets by mouth every 4 (four) hours as needed for severe pain. 11/09/15   Treacy Holcomb Patel-Mills, PA-C   BP 101/66 mmHg  Pulse 78  Temp(Src) 98.4 F (36.9 C) (Oral)  Resp 16  SpO2 100% Physical Exam  Constitutional: She is oriented to person, place, and time. She appears well-developed and well-nourished. No distress.  HENT:  Head: Normocephalic and atraumatic.  Eyes: Conjunctivae and EOM are  normal.  Neck: Normal range of motion. Neck supple.  Cardiovascular: Normal rate, regular rhythm and normal heart sounds.   RRR  Pulmonary/Chest: Effort normal and breath sounds normal. No respiratory distress.  Lungs clear  Abdominal: Soft. She exhibits no distension. There is tenderness in the right lower quadrant. There is no rebound and no guarding.  RLQ tenderness without guarding or rebound. Negative rosving's, obturator, and heel-tap. No  distension. Thin appearing.   Musculoskeletal: Normal range of motion.  Neurological: She is alert and oriented to person, place, and time.  Skin: Skin is warm and dry. She is not diaphoretic.  Psychiatric: She has a normal mood and affect. Her behavior is normal.  Nursing note and vitals reviewed.   ED Course  Procedures (including critical care time) DIAGNOSTIC STUDIES: Oxygen Saturation is 99% on RA, nl by my interpretation.    COORDINATION OF CARE: 2:08 PM Discussed treatment plan with pt at bedside which includes labs, Pain medication, UA, US transvaginal non-OB, US pelvis complete, and [place] art/ven flow abd/pelv doppler, and pt agreed to plan.    Labs Review Labs Reviewed  COMPREHENSIVE METABOLIC PANEL - Abnormal; Notable for the following:    ALT 11 (*)    All other components within normal limits  CBC - Abnormal; Notable for the following:    Hemoglobin 11.7 (*)    HCT 35.6 (*)    All other components within normal limits  URINALYSIS, ROUTINE W REFLEX MICROSCOPIC (NOT AT [facility]) - Abnormal; Notable for the following:    APPearance CLOUDY (*)    Leukocytes, UA SMALL (*)    All other components within normal limits  URINE MICROSCOPIC-ADD ON - Abnormal; Notable for the following:    Squamous Epithelial / LPF 6-30 (*)    Bacteria, UA MANY (*)    All other components within normal limits  URINE CULTURE  LIPASE, BLOOD  I-STAT BETA HCG BLOOD, ED ([facility], [facility], [facility] ONLY)    Imaging Review US Transvaginal Non-ob  11/09/2015  CLINICAL DATA:  Right pelvic pain. Laparoscopic right ovarian cystectomy. Presumed endometrioma on pathology. EXAM: TRANSABDOMINAL AND TRANSVAGINAL ULTRASOUND OF PELVIS DOPPLER ULTRASOUND OF OVARIES TECHNIQUE: Both transabdominal and transvaginal ultrasound examinations of the pelvis were performed. Transabdominal technique was performed for global imaging of the pelvis including uterus, ovaries, adnexal regions, and pelvic cul-de-sac. It was necessary to proceed  with endovaginal exam following the transabdominal exam to visualize the ovaries and endometrium. Color and duplex Doppler ultrasound was utilized to evaluate blood flow to the ovaries. COMPARISON:  02/22/2015 FINDINGS: Uterus Measurements: 9 x 4 x 4 cm. No fibroids or other mass visualized. Endometrium Thickness: 9 mm. Minimal internal fluid. No focal abnormality visualized. Right ovary Measurements: 85 x 61 x 79 mm. Asymmetric enlargement is secondary to a 7 cm mass with homogeneous mid level echoes and single thin septation. There is no internal blood flow. Left ovary Measurements: 22 x 12 x 13 mm. Normal appearance/no adnexal mass. Pulsed Doppler evaluation of both ovaries demonstrates normal low-resistance arterial and venous waveforms. Other findings No abnormal free fluid. IMPRESSION: 1. No acute finding.  No indication of ovarian torsion. 2. Recurrent 7 cm right adnexal mass consistent with endometrioma. Electronically Signed   By: Marnee Spring M.D.   On: 11/09/2015 15:50   US Pelvis Complete  11/09/2015  CLINICAL DATA:  Right pelvic pain. Laparoscopic right ovarian cystectomy. Presumed endometrioma on pathology. EXAM: TRANSABDOMINAL AND TRANSVAGINAL ULTRASOUND OF PELVIS DOPPLER ULTRASOUND OF OVARIES TECHNIQUE: Both transabdominal and transvaginal ultrasound  examinations of the pelvis were performed. Transabdominal technique was performed for global imaging of the pelvis including uterus, ovaries, adnexal regions, and pelvic cul-de-sac. It was necessary to proceed with endovaginal exam following the transabdominal exam to visualize the ovaries and endometrium. Color and duplex Doppler ultrasound was utilized to evaluate blood flow to the ovaries. COMPARISON:  02/22/2015 FINDINGS: Uterus Measurements: 9 x 4 x 4 cm. No fibroids or other mass visualized. Endometrium Thickness: 9 mm. Minimal internal fluid. No focal abnormality visualized. Right ovary Measurements: 85 x 61 x 79 mm. Asymmetric enlargement is  secondary to a 7 cm mass with homogeneous mid level echoes and single thin septation. There is no internal blood flow. Left ovary Measurements: 22 x 12 x 13 mm. Normal appearance/no adnexal mass. Pulsed Doppler evaluation of both ovaries demonstrates normal low-resistance arterial and venous waveforms. Other findings No abnormal free fluid. IMPRESSION: 1. No acute finding.  No indication of ovarian torsion. 2. Recurrent 7 cm right adnexal mass consistent with endometrioma. Electronically Signed   By: Marnee SpringJonathon  Watts M.D.   On: 11/09/2015 15:50   [place] Art/ven Flow Abd Pelv Doppler  11/09/2015  CLINICAL DATA:  Right pelvic pain. Laparoscopic right ovarian cystectomy. Presumed endometrioma on pathology. EXAM: TRANSABDOMINAL AND TRANSVAGINAL ULTRASOUND OF PELVIS DOPPLER ULTRASOUND OF OVARIES TECHNIQUE: Both transabdominal and transvaginal ultrasound examinations of the pelvis were performed. Transabdominal technique was performed for global imaging of the pelvis including uterus, ovaries, adnexal regions, and pelvic cul-de-sac. It was necessary to proceed with endovaginal exam following the transabdominal exam to visualize the ovaries and endometrium. Color and duplex Doppler ultrasound was utilized to evaluate blood flow to the ovaries. COMPARISON:  02/22/2015 FINDINGS: Uterus Measurements: 9 x 4 x 4 cm. No fibroids or other mass visualized. Endometrium Thickness: 9 mm. Minimal internal fluid. No focal abnormality visualized. Right ovary Measurements: 85 x 61 x 79 mm. Asymmetric enlargement is secondary to a 7 cm mass with homogeneous mid level echoes and single thin septation. There is no internal blood flow. Left ovary Measurements: 22 x 12 x 13 mm. Normal appearance/no adnexal mass. Pulsed Doppler evaluation of both ovaries demonstrates normal low-resistance arterial and venous waveforms. Other findings No abnormal free fluid. IMPRESSION: 1. No acute finding.  No indication of ovarian torsion. 2. Recurrent 7 cm  right adnexal mass consistent with endometrioma. Electronically Signed   By: Marnee SpringJonathon  Watts M.D.   On: 11/09/2015 15:50   I have personally reviewed and evaluated these images and lab results as part of my medical decision-making.   EKG Interpretation None      MDM   Final diagnoses:  Right lower quadrant pain  Anemia, unspecified anemia type  Endometrioma  Patient with history of large right ovarian cyst removed laparoscopically presents with RLQ pain that is similar to when she had a cyst previously. Vitals are stable and she is well appearing.  She has RLQ tenderness on exam.  Patient not pregnant. Labs show anemia but are otherwise normal. She does have a 7 cm right adnexal mass consistent with endometrioma but no ovarian torsion. She has no vaginal bleeding or discharge, nausea/vomiting.  She can follow up with Dr. Jolayne Pantheronstant in clinic tomorrow by calling the office.  I discussed return precautions with the patient.  I explained that she should take ibuprofen for pain and take percocet for breakthrough pain.  I discussed not taking percocet when going to work.  She agrees with plan.  Filed Vitals:   11/09/15 1430 11/09/15 1603  BP:  108/82 101/66  Pulse: 92 78  Temp:    Resp: 18 16   Medications  morphine 4 MG/ML injection 4 mg (4 mg Intravenous Given 11/09/15 1417)   I personally performed the services described in this documentation, which was scribed in my presence. The recorded information has been reviewed and is accurate.    Catha Gosselin, PA-C 11/09/15 1643  Gerhard Munch, MD 11/11/15 1250

## 2015-11-09 NOTE — ED Notes (Signed)
Discharge instructions, follow up care, and rx x2 reviewed with patient. Patient verbalized understanding. Patient made aware that it is not advised to drive after taking narcotic medications.

## 2015-11-09 NOTE — ED Notes (Signed)
PT is aware of urine sample 

## 2015-11-09 NOTE — Discharge Instructions (Signed)
Follow up with Dr. Jolayne Pantheronstant tomorrow in office.  Return for increased abdominal pain, vomiting, vaginal bleeding, dizziness, or light headedness.

## 2015-11-09 NOTE — ED Notes (Signed)
Had surgery on ovary last October for cyst the size of a baseball. States she had same pain starting again on right lower abdomin yesterday, states they told her it could come back. Also states she noticed some blood while wiping after using the bathroom yesterday. Unsure of wether it came from urethra, vagina, or rectum. Did not take any antipyretics before arrival today.

## 2015-11-10 LAB — URINE CULTURE: Special Requests: NORMAL

## 2015-11-21 ENCOUNTER — Encounter (HOSPITAL_COMMUNITY): Payer: Self-pay | Admitting: Emergency Medicine

## 2015-11-21 ENCOUNTER — Emergency Department (HOSPITAL_COMMUNITY)
Admission: EM | Admit: 2015-11-21 | Discharge: 2015-11-21 | Disposition: A | Discharge status: Home / Self Care | Payer: Self-pay | Attending: Emergency Medicine | Admitting: Emergency Medicine

## 2015-11-21 DIAGNOSIS — R109 Unspecified abdominal pain: Secondary | ICD-10-CM | POA: Insufficient documentation

## 2015-11-21 DIAGNOSIS — R300 Dysuria: Secondary | ICD-10-CM | POA: Insufficient documentation

## 2015-11-21 DIAGNOSIS — Z8781 Personal history of (healed) traumatic fracture: Secondary | ICD-10-CM | POA: Insufficient documentation

## 2015-11-21 DIAGNOSIS — R319 Hematuria, unspecified: Secondary | ICD-10-CM | POA: Insufficient documentation

## 2015-11-21 LAB — URINALYSIS, ROUTINE W REFLEX MICROSCOPIC
BILIRUBIN URINE: NEGATIVE
Glucose, UA: NEGATIVE mg/dL
Ketones, ur: NEGATIVE mg/dL
Leukocytes, UA: NEGATIVE
Nitrite: NEGATIVE
PROTEIN: NEGATIVE mg/dL
Specific Gravity, Urine: 1.011 (ref 1.005–1.030)
pH: 6.5 (ref 5.0–8.0)

## 2015-11-21 LAB — CBC
HCT: 34.2 % — ABNORMAL LOW (ref 36.0–46.0)
Hemoglobin: 11.4 g/dL — ABNORMAL LOW (ref 12.0–15.0)
MCH: 30.8 pg (ref 26.0–34.0)
MCHC: 33.3 g/dL (ref 30.0–36.0)
MCV: 92.4 fL (ref 78.0–100.0)
PLATELETS: 241 10*3/uL (ref 150–400)
RBC: 3.7 MIL/uL — ABNORMAL LOW (ref 3.87–5.11)
RDW: 12.4 % (ref 11.5–15.5)
WBC: 5 10*3/uL (ref 4.0–10.5)

## 2015-11-21 LAB — BASIC METABOLIC PANEL
Anion gap: 4 — ABNORMAL LOW (ref 5–15)
BUN: 11 mg/dL (ref 6–20)
CHLORIDE: 108 mmol/L (ref 101–111)
CO2: 27 mmol/L (ref 22–32)
Calcium: 8.9 mg/dL (ref 8.9–10.3)
Creatinine, Ser: 0.71 mg/dL (ref 0.44–1.00)
GFR calc Af Amer: >60 mL/min (ref >60)
GLUCOSE: 101 mg/dL — AB (ref 65–99)
POTASSIUM: 4.2 mmol/L (ref 3.5–5.1)
Sodium: 139 mmol/L (ref 135–145)

## 2015-11-21 LAB — URINE MICROSCOPIC-ADD ON

## 2015-11-21 NOTE — ED Notes (Signed)
Pt c/o hematuria and dysuria x 1 week. Pt denies strong urine odor or flank pain. A&Ox4 and ambulatory. Pt seen a week ago and diagnosed with a cyst on her ovary. Pt sts she wasn't bleeding at that time.

## 2015-11-21 NOTE — Discharge Instructions (Signed)
Please call Dr. Imelda Pillowannenbaum's office to schedule a follow up appointment for this week. Return to the ER for new or worsening symptoms.   Hematuria, Adult Hematuria is blood in your urine. It can be caused by a bladder infection, kidney infection, prostate infection, kidney stone, or cancer of your urinary tract. Infections can usually be treated with medicine, and a kidney stone usually will pass through your urine. If neither of these is the cause of your hematuria, further workup to find out the reason may be needed. It is very important that you tell your health care provider about any blood you see in your urine, even if the blood stops without treatment or happens without causing pain. Blood in your urine that happens and then stops and then happens again can be a symptom of a very serious condition. Also, pain is not a symptom in the initial stages of many urinary cancers. HOME CARE INSTRUCTIONS   Drink lots of fluid, 3-4 quarts a day. If you have been diagnosed with an infection, cranberry juice is especially recommended, in addition to large amounts of water.  Avoid caffeine, tea, and carbonated beverages because they tend to irritate the bladder.  Avoid alcohol because it may irritate the prostate.  Take all medicines as directed by your health care provider.  If you were prescribed an antibiotic medicine, finish it all even if you start to feel better.  If you have been diagnosed with a kidney stone, follow your health care provider's instructions regarding straining your urine to catch the stone.  Empty your bladder often. Avoid holding urine for long periods of time.  After a bowel movement, women should cleanse front to back. Use each tissue only once.  Empty your bladder before and after sexual intercourse if you are a female. SEEK MEDICAL CARE IF:  You develop back pain.  You have a fever.  You have a feeling of sickness in your stomach (nausea) or vomiting.  Your  symptoms are not better in 3 days. Return sooner if you are getting worse. SEEK IMMEDIATE MEDICAL CARE IF:   You develop severe vomiting and are unable to keep the medicine down.  You develop severe back or abdominal pain despite taking your medicines.  You begin passing a large amount of blood or clots in your urine.  You feel extremely weak or faint, or you pass out. MAKE SURE YOU:   Understand these instructions.  Will watch your condition.  Will get help right away if you are not doing well or get worse.   This information is not intended to replace advice given to you by your health care provider. Make sure you discuss any questions you have with your health care provider.   Document Released: 07/24/2005 Document Revised: 08/14/2014 Document Reviewed: 03/24/2013 Elsevier Interactive Patient Education [employer]2016 [employer]

## 2015-11-21 NOTE — ED Provider Notes (Signed)
CSN: [id number]     Arrival date & time 11/21/15  1042 History   First MD Initiated Contact with Patient 11/21/15 1303     Chief Complaint  Patient presents with  . Hematuria    HPI  Whitney Carter is an 25 y.o. female with no significant PMH who presents to the ED for evaluation of dysuria and gross hematuria. She states that her urinary symptoms began earlier this week, perhaps about three days ago. She states it is painful when she urinates and her urine is red-tinged. She states she does have some pain in right side of her abdomen but states this has been going on "for a while" and she thinks this is her chronic pain. US done on 4/4 does reveal adnexal mass on the right side consistent with endometrioma. She states that the pain feels the same in severity as her ongoing pain and is not significantly worse. Denies fever, chills, nausea, vomiting, diarrhea. She has been taking ibuprofen as needed for her abdominal pain. She states she has not had a UTI or kidney stones before. She denies any vaginal complaints.  Past Medical History  Diagnosis Date  . Dislocation of right shoulder joint   . Scaphoid fracture, wrist, closed     right   Past Surgical History  Procedure Laterality Date  . Laparoscopic ovarian cystectomy Right 05/18/2015    Procedure: LAPAROSCOPIC OVARIAN CYSTECTOMY;  Surgeon: Catalina AntiguaPeggy Constant, MD;  Location: [facility];  Service: Gynecology;  Laterality: Right;  Requested 05-18-15 @ 7:30a   Family History  Problem Relation Age of Onset  . Hypertension Mother   . Hypertension Maternal Grandmother   . Diabetes Maternal Grandmother   . Hyperlipidemia Maternal Grandmother    Social History  Substance Use Topics  . Smoking status: Never Smoker   . Smokeless tobacco: None  . Alcohol Use: Yes   OB History    Gravida Para Term Preterm AB TAB SAB Ectopic Multiple Living   1 0 0 0 0 0 0 0 0 0      Review of Systems  All other systems reviewed and are negative.     Allergies   Bee venom  Home Medications   Prior to Admission medications   Medication Sig Start Date End Date Taking? Authorizing Provider  docusate sodium (COLACE) 100 MG capsule Take 1 capsule (100 mg total) by mouth 2 (two) times daily as needed. Patient not taking: Reported on 11/09/2015 05/18/15   Catalina AntiguaPeggy Constant, MD  ibuprofen (ADVIL,MOTRIN) 600 MG tablet Take 1 tablet (600 mg total) by mouth every 6 (six) hours as needed. Patient not taking: Reported on 11/21/2015 11/09/15   Catha GosselinHanna Patel-Mills, PA-C  oxyCODONE-acetaminophen (PERCOCET/ROXICET) 5-325 MG tablet Take 2 tablets by mouth every 4 (four) hours as needed for severe pain. Patient not taking: Reported on 11/21/2015 11/09/15   Catha GosselinHanna Patel-Mills, PA-C   BP 113/84 mmHg  Pulse 89  Temp(Src) 97.6 F (36.4 C) (Oral)  Resp 18  SpO2 100%  LMP 11/07/2015 Physical Exam  Constitutional: She is oriented to person, place, and time.  HENT:  Right Ear: External ear normal.  Left Ear: External ear normal.  Nose: Nose normal.  Mouth/Throat: Oropharynx is clear and moist. No oropharyngeal exudate.  Eyes: Conjunctivae and EOM are normal. Pupils are equal, round, and reactive to light.  Neck: Normal range of motion. Neck supple.  Cardiovascular: Normal rate, regular rhythm, normal heart sounds and intact distal pulses.   Pulmonary/Chest: Effort normal and breath sounds normal. No respiratory  distress. She has no wheezes. She exhibits no tenderness.  Abdominal: Soft. Bowel sounds are normal. She exhibits no distension. There is no tenderness. There is no rebound and no guarding.  No tenderness. Abdomen soft, no guarding, no rebound No CVA tenderness  Musculoskeletal: She exhibits no edema.  Neurological: She is alert and oriented to person, place, and time. No cranial nerve deficit.  Skin: Skin is warm and dry.  Psychiatric: She has a normal mood and affect.  Nursing note and vitals reviewed.   ED Course  Procedures (including critical care time) Labs  Review Labs Reviewed  URINALYSIS, ROUTINE W REFLEX MICROSCOPIC (NOT AT [facility]) - Abnormal; Notable for the following:    Hgb urine dipstick LARGE (*)    All other components within normal limits  BASIC METABOLIC PANEL - Abnormal; Notable for the following:    Glucose, Bld 101 (*)    Anion gap 4 (*)    All other components within normal limits  CBC - Abnormal; Notable for the following:    RBC 3.70 (*)    Hemoglobin 11.4 (*)    HCT 34.2 (*)    All other components within normal limits  URINE MICROSCOPIC-ADD ON - Abnormal; Notable for the following:    Squamous Epithelial / LPF 6-30 (*)    Bacteria, UA FEW (*)    All other components within normal limits  URINE CULTURE    Imaging Review No results found. I have personally reviewed and evaluated these images and lab results as part of my medical decision-making.   EKG Interpretation None      MDM   Final diagnoses:  Hematuria    bloodwork unremarkable. Surprisingly, UA shows large hemoglobin but not suggestive of true UTI as there is evidence of contamination with only few bacteria and no WBC. Will send for culture. Discussed renal CT vs renal US with pt. At this point she states she feels better and would like to just go home and f/u as an outpatient. She is unwilling to wait for imaging. She is nontoxic appearing with stable VS and safe for discharge. Instructed to f/u with urology. She states she has OBGYN f/u soon as well. ER return precautions given.    Carlene Coria, PA-C 11/22/15 1610  Bethann Berkshire, MD 11/23/15 [phone]

## 2015-11-23 LAB — URINE CULTURE

## 2015-12-08 ENCOUNTER — Ambulatory Visit (INDEPENDENT_AMBULATORY_CARE_PROVIDER_SITE_OTHER): Payer: Self-pay | Admitting: Obstetrics & Gynecology

## 2015-12-08 ENCOUNTER — Encounter: Payer: Self-pay | Admitting: Obstetrics & Gynecology

## 2015-12-08 VITALS — BP 112/58 | HR 94 | Wt 135.5 lb

## 2015-12-08 DIAGNOSIS — N801 Endometriosis of ovary: Secondary | ICD-10-CM

## 2015-12-08 DIAGNOSIS — N80129 Deep endometriosis of ovary, unspecified ovary: Secondary | ICD-10-CM

## 2015-12-08 NOTE — Progress Notes (Signed)
Patient ID: Whitney Carter, female   DOB: [birth year], 25 y.o.   MRN: [id number] History:  25 y.o. G1P0000 here today for eval of RLQ pain. She was seen in the ED and dx with recurrent endometrioma of the right ovary.  Pt reports pain that is often 7/10.  She has been taking NSAIDS and an occ left over Percocet.  She does not like taking narcs.  She reports that the pain can interfere with her work at the [employer].  The following portions of the patient's history were reviewed and updated as appropriate: allergies, current medications, past family history, past medical history, past social history, past surgical history and problem list.  Review of Systems:  Pertinent items are noted in HPI.  Objective:  Physical Exam Blood pressure 112/58, pulse 94, weight 135 lb 8 oz (61.462 kg), last menstrual period 11/07/2015. Gen: NAD Abd: Soft, tender in RLQ.  No rebound or guarding; nondistended Pelvic: deferred  Labs and Imaging [place] Transvaginal Non-ob  11/09/2015  CLINICAL DATA:  Right pelvic pain. Laparoscopic right ovarian cystectomy. Presumed endometrioma on pathology. EXAM: TRANSABDOMINAL AND TRANSVAGINAL ULTRASOUND OF PELVIS DOPPLER ULTRASOUND OF OVARIES TECHNIQUE: Both transabdominal and transvaginal ultrasound examinations of the pelvis were performed. Transabdominal technique was performed for global imaging of the pelvis including uterus, ovaries, adnexal regions, and pelvic cul-de-sac. It was necessary to proceed with endovaginal exam following the transabdominal exam to visualize the ovaries and endometrium. Color and duplex Doppler ultrasound was utilized to evaluate blood flow to the ovaries. COMPARISON:  02/22/2015 FINDINGS: Uterus Measurements: 9 x 4 x 4 cm. No fibroids or other mass visualized. Endometrium Thickness: 9 mm. Minimal internal fluid. No focal abnormality visualized. Right ovary Measurements: 85 x 61 x 79 mm. Asymmetric enlargement is secondary to a 7 cm mass with  homogeneous mid level echoes and single thin septation. There is no internal blood flow. Left ovary Measurements: 22 x 12 x 13 mm. Normal appearance/no adnexal mass. Pulsed Doppler evaluation of both ovaries demonstrates normal low-resistance arterial and venous waveforms. Other findings No abnormal free fluid. IMPRESSION: 1. No acute finding.  No indication of ovarian torsion. 2. Recurrent 7 cm right adnexal mass consistent with endometrioma. Electronically Signed   By: Marnee SpringJonathon  Watts M.D.   On: 11/09/2015 15:50   [place] Pelvis Complete  11/09/2015  CLINICAL DATA:  Right pelvic pain. Laparoscopic right ovarian cystectomy. Presumed endometrioma on pathology. EXAM: TRANSABDOMINAL AND TRANSVAGINAL ULTRASOUND OF PELVIS DOPPLER ULTRASOUND OF OVARIES TECHNIQUE: Both transabdominal and transvaginal ultrasound examinations of the pelvis were performed. Transabdominal technique was performed for global imaging of the pelvis including uterus, ovaries, adnexal regions, and pelvic cul-de-sac. It was necessary to proceed with endovaginal exam following the transabdominal exam to visualize the ovaries and endometrium. Color and duplex Doppler ultrasound was utilized to evaluate blood flow to the ovaries. COMPARISON:  02/22/2015 FINDINGS: Uterus Measurements: 9 x 4 x 4 cm. No fibroids or other mass visualized. Endometrium Thickness: 9 mm. Minimal internal fluid. No focal abnormality visualized. Right ovary Measurements: 85 x 61 x 79 mm. Asymmetric enlargement is secondary to a 7 cm mass with homogeneous mid level echoes and single thin septation. There is no internal blood flow. Left ovary Measurements: 22 x 12 x 13 mm. Normal appearance/no adnexal mass. Pulsed Doppler evaluation of both ovaries demonstrates normal low-resistance arterial and venous waveforms. Other findings No abnormal free fluid. IMPRESSION: 1. No acute finding.  No indication of ovarian torsion. 2. Recurrent 7 cm right adnexal mass consistent with  endometrioma.  Electronically Signed   By: Marnee Spring M.D.   On: 11/09/2015 15:50   [place] Art/ven Flow Abd Pelv Doppler  11/09/2015  CLINICAL DATA:  Right pelvic pain. Laparoscopic right ovarian cystectomy. Presumed endometrioma on pathology. EXAM: TRANSABDOMINAL AND TRANSVAGINAL ULTRASOUND OF PELVIS DOPPLER ULTRASOUND OF OVARIES TECHNIQUE: Both transabdominal and transvaginal ultrasound examinations of the pelvis were performed. Transabdominal technique was performed for global imaging of the pelvis including uterus, ovaries, adnexal regions, and pelvic cul-de-sac. It was necessary to proceed with endovaginal exam following the transabdominal exam to visualize the ovaries and endometrium. Color and duplex Doppler ultrasound was utilized to evaluate blood flow to the ovaries. COMPARISON:  02/22/2015 FINDINGS: Uterus Measurements: 9 x 4 x 4 cm. No fibroids or other mass visualized. Endometrium Thickness: 9 mm. Minimal internal fluid. No focal abnormality visualized. Right ovary Measurements: 85 x 61 x 79 mm. Asymmetric enlargement is secondary to a 7 cm mass with homogeneous mid level echoes and single thin septation. There is no internal blood flow. Left ovary Measurements: 22 x 12 x 13 mm. Normal appearance/no adnexal mass. Pulsed Doppler evaluation of both ovaries demonstrates normal low-resistance arterial and venous waveforms. Other findings No abnormal free fluid. IMPRESSION: 1. No acute finding.  No indication of ovarian torsion. 2. Recurrent 7 cm right adnexal mass consistent with endometrioma. Electronically Signed   By: Marnee Spring M.D.   On: 11/09/2015 15:50    Assessment & Plan:  Recurrent right endometrioma- discussed with pt endometrioma and endometriosis and risk of recurrence.  Also reviewed with pt treatment options such as OCPs and Lupron. Pt will opt for OCPs post op.  Pt declines refill on Percocet.  Patient desires surgical management with laparoscopic right ovarian cystectomy.  The risks of  surgery were discussed in detail with the patient including but not limited to: bleeding which may require transfusion or reoperation; infection which may require prolonged hospitalization or re-hospitalization and antibiotic therapy; injury to bowel, bladder, ureters and major vessels or other surrounding organs; need for additional procedures including laparotomy; thromboembolic phenomenon, incisional problems and other postoperative or anesthesia complications.  Patient was told that the likelihood that her condition and symptoms will be treated effectively with this surgical management was very high; the postoperative expectations were also discussed in detail. The patient also understands the alternative treatment options which were discussed in full. All questions were answered.  She was told that she will be contacted by our surgical scheduler regarding the time and date of her surgery; routine preoperative instructions of having nothing to eat or drink after midnight on the day prior to surgery and also coming to the hospital 1 1/2 hours prior to her time of surgery were also emphasized.  She was told she may be called for a preoperative appointment about a week prior to surgery and will be given further preoperative instructions at that visit. Printed patient education handouts about the procedure were given to the patient to review at home.  Addendum: will have nursing staff call pt to stop NSAIDs in preparation for surgery.  Sigrid Schwebach L. Harraway-Smith, M.D., [facility]

## 2015-12-09 ENCOUNTER — Encounter: Payer: Self-pay | Admitting: Obstetrics & Gynecology

## 2015-12-09 ENCOUNTER — Encounter (HOSPITAL_COMMUNITY): Payer: Self-pay | Admitting: *Deleted

## 2015-12-15 ENCOUNTER — Encounter: Payer: Self-pay | Admitting: Obstetrics & Gynecology

## 2015-12-26 ENCOUNTER — Encounter (HOSPITAL_COMMUNITY): Payer: Self-pay

## 2015-12-26 ENCOUNTER — Emergency Department (HOSPITAL_COMMUNITY): Payer: Self-pay

## 2015-12-26 ENCOUNTER — Emergency Department (HOSPITAL_COMMUNITY)
Admission: EM | Admit: 2015-12-26 | Discharge: 2015-12-26 | Disposition: A | Discharge status: Home / Self Care | Payer: Self-pay | Attending: Emergency Medicine | Admitting: Emergency Medicine

## 2015-12-26 DIAGNOSIS — Z79899 Other long term (current) drug therapy: Secondary | ICD-10-CM | POA: Insufficient documentation

## 2015-12-26 DIAGNOSIS — Z79891 Long term (current) use of opiate analgesic: Secondary | ICD-10-CM | POA: Insufficient documentation

## 2015-12-26 DIAGNOSIS — S93401A Sprain of unspecified ligament of right ankle, initial encounter: Secondary | ICD-10-CM | POA: Insufficient documentation

## 2015-12-26 DIAGNOSIS — Z791 Long term (current) use of non-steroidal anti-inflammatories (NSAID): Secondary | ICD-10-CM | POA: Insufficient documentation

## 2015-12-26 DIAGNOSIS — Y9341 Activity, dancing: Secondary | ICD-10-CM | POA: Insufficient documentation

## 2015-12-26 DIAGNOSIS — X509XXA Other and unspecified overexertion or strenuous movements or postures, initial encounter: Secondary | ICD-10-CM | POA: Insufficient documentation

## 2015-12-26 DIAGNOSIS — Y929 Unspecified place or not applicable: Secondary | ICD-10-CM | POA: Insufficient documentation

## 2015-12-26 DIAGNOSIS — Y999 Unspecified external cause status: Secondary | ICD-10-CM | POA: Insufficient documentation

## 2015-12-26 MED ORDER — IBUPROFEN 200 MG PO TABS
600.0000 mg | ORAL_TABLET | Freq: Once | ORAL | Status: AC
  Administered 2015-12-26: 600 mg via ORAL
  Filled 2015-12-26: qty 3

## 2015-12-26 NOTE — ED Provider Notes (Signed)
CSN: [id number]     Arrival date & time 12/26/15  0444 History   First MD Initiated Contact with Patient 12/26/15 [phone]     Chief Complaint  Patient presents with  . Ankle Pain     (Consider location/radiation/quality/duration/timing/severity/associated sxs/prior Treatment) HPI Whitney Carter is a 25 y.o. female consider evaluation of right ankle pain. Patient ports approximately 2:30 AM, while dancing she twisted her right ankle. She reports sudden onset sharp throbbing pain. She reports a warm washcloth on her ankle which did not improve her symptoms. She denies any numbness or weakness. Discomfort is exacerbated with palpation, toe movement and walking. Denies any knee pain. Pain is currently rated as moderate.  Past Medical History  Diagnosis Date  . Dislocation of right shoulder joint   . Scaphoid fracture, wrist, closed     right   Past Surgical History  Procedure Laterality Date  . Laparoscopic ovarian cystectomy Right 05/18/2015    Procedure: LAPAROSCOPIC OVARIAN CYSTECTOMY;  Surgeon: Catalina Antigua, MD;  Location: [facility];  Service: Gynecology;  Laterality: Right;  Requested 05-18-15 @ 7:30a   Family History  Problem Relation Age of Onset  . Hypertension Mother   . Hypertension Maternal Grandmother   . Diabetes Maternal Grandmother   . Hyperlipidemia Maternal Grandmother    Social History  Substance Use Topics  . Smoking status: Never Smoker   . Smokeless tobacco: None  . Alcohol Use: Yes     Comment: socially   OB History    Gravida Para Term Preterm AB TAB SAB Ectopic Multiple Living       Review of Systems A 10 point review of systems was completed and was negative except for pertinent positives and negatives as mentioned in the history of present illness     Allergies  Bee venom  Home Medications   Prior to Admission medications   Medication Sig Start Date End Date Taking? Authorizing Provider  docusate sodium (COLACE) 100 MG  capsule Take 1 capsule (100 mg total) by mouth 2 (two) times daily as needed. Patient not taking: Reported on 11/09/2015 05/18/15   Catalina Antigua, MD  ibuprofen (ADVIL,MOTRIN) 600 MG tablet Take 1 tablet (600 mg total) by mouth every 6 (six) hours as needed. Patient not taking: Reported on 11/21/2015 11/09/15   Catha Gosselin, PA-C  oxyCODONE-acetaminophen (PERCOCET/ROXICET) 5-325 MG tablet Take 2 tablets by mouth every 4 (four) hours as needed for severe pain. Patient not taking: Reported on 11/21/2015 11/09/15   Catha Gosselin, PA-C   BP 114/71 mmHg  Pulse 95  Resp 16  SpO2 100%  LMP 12/20/2015 (Approximate) Physical Exam  Constitutional: She is oriented to person, place, and time. She appears well-developed and well-nourished.  HENT:  Head: Normocephalic and atraumatic.  Mouth/Throat: Oropharynx is clear and moist.  Eyes: Conjunctivae are normal. Pupils are equal, round, and reactive to light. Right eye exhibits no discharge. Left eye exhibits no discharge. No scleral icterus.  Neck: Neck supple.  Cardiovascular: Normal rate, regular rhythm and normal heart sounds.   Pulmonary/Chest: Effort normal and breath sounds normal. No respiratory distress. She has no wheezes. She has no rales.  Abdominal: Soft. There is no tenderness.  Musculoskeletal:  Mild tenderness to right lateral malleolus on ankle. No obvious swelling, erythema or other deformity. Sensation intact to light touch. Distal pulses intact with brisk cap refill. No other lesions or deformities noted. Full active range of motion of right knee with  no pain  Neurological: She is alert and oriented to person, place, and time.  Cranial Nerves II-XII grossly intact  Skin: Skin is warm and dry. No rash noted.  Psychiatric: She has a normal mood and affect.  Nursing note and vitals reviewed.   ED Course  Procedures (including critical care time) Labs Review Labs Reviewed - No data to display  Imaging Review Dg Ankle Complete  Right  12/26/2015  CLINICAL DATA:  Lateral right ankle pain.  Injury while dancing. EXAM: RIGHT ANKLE - COMPLETE 3+ VIEW COMPARISON:  None. FINDINGS: There is no evidence of fracture, dislocation, or joint effusion. There is no evidence of arthropathy or other focal bone abnormality. Soft tissues are unremarkable. IMPRESSION: Negative. Electronically Signed   By: Burman NievesWilliam  Stevens M.D.   On: 12/26/2015 06:11   I have personally reviewed and evaluated these images and lab results as part of my medical decision-making.   EKG Interpretation None      MDM  Patient X-Ray negative for obvious fracture or dislocation. Neurovascularly intact. Pain managed in ED. Pt advised to follow up with orthopedics if symptoms persist for possibility of missed fracture diagnosis. Patient given Ace wrap while in ED, conservative therapy recommended and discussed. Patient will be dc home & is agreeable with above plan.  Final diagnoses:  Ankle sprain, right, initial encounter        Joycie PeekBenjamin Mccormick [place], PA-C 12/26/15 1340  Leta BaptistEmily Roe Nguyen, MD 01/02/16 [phone]

## 2015-12-26 NOTE — ED Notes (Signed)
Patient c/o right ankle pain.  Patient stated that she was dancing and her ankle twisted.  Patient states that unable to lift her toes without significant pain.  Patient rates pain 10/10.  NAD at this time.

## 2015-12-26 NOTE — Discharge Instructions (Signed)
You likely sprained your ankle. Your x-ray was negative. Take Motrin as we discussed. Follow up with orthopedics if your symptoms do not improve over the next 1-2 weeks. Return to ED for any new or worsening symptoms as we discussed.  Ankle Sprain An ankle sprain is an injury to the strong, fibrous tissues (ligaments) that hold your ankle bones together.  HOME CARE   Put ice on your ankle for 1-2 days or as told by your doctor.  Put ice in a plastic bag.  Place a towel between your skin and the bag.  Leave the ice on for 15-20 minutes at a time, every 2 hours while you are awake.  Only take medicine as told by your doctor.  Raise (elevate) your injured ankle above the level of your heart as much as possible for 2-3 days.  Use crutches if your doctor tells you to. Slowly put your own weight on the affected ankle. Use the crutches until you can walk without pain.  If you have a plaster splint:  Do not rest it on anything harder than a pillow for 24 hours.  Do not put weight on it.  Do not get it wet.  Take it off to shower or bathe.  If given, use an elastic wrap or support stocking for support. Take the wrap off if your toes lose feeling (numb), tingle, or turn cold or blue.  If you have an air splint:  Add or let out air to make it comfortable.  Take it off at night and to shower and bathe.  Wiggle your toes and move your ankle up and down often while you are wearing it. GET HELP IF:  You have rapidly increasing bruising or puffiness (swelling).  Your toes feel very cold.  You lose feeling in your foot.  Your medicine does not help your pain. GET HELP RIGHT AWAY IF:   Your toes lose feeling (numb) or turn blue.  You have severe pain that is increasing. MAKE SURE YOU:   Understand these instructions.  Will watch your condition.  Will get help right away if you are not doing well or get worse.   This information is not intended to replace advice given to you  by your health care provider. Make sure you discuss any questions you have with your health care provider.   Document Released: 01/10/2008 Document Revised: 08/14/2014 Document Reviewed: 02/05/2012 Elsevier Interactive Patient Education [employer]2016 [employer] Inc.

## 2015-12-28 ENCOUNTER — Other Ambulatory Visit: Payer: Self-pay | Admitting: Obstetrics and Gynecology

## 2015-12-30 NOTE — Patient Instructions (Signed)
Your procedure is scheduled on:  Monday, January 10, 2016  [facility] at: 8:45 AM  Pick up the phone at the desk and dial [phone].  Call this number if you have problems the morning of surgery: [phone].  Remember: Do NOT eat food or drink after: Midnight Sunday, January 09, 2016  Take these medicines the morning of surgery with a SIP OF WATER: None  Do NOT wear jewelry (body piercing), metal hair clips/bobby pins, make-up, or nail polish. Do NOT wear lotions, powders, or perfumes.  You may wear deodorant. Do NOT shave for 48 hours prior to surgery. Do NOT bring valuables to the hospital. Contacts, dentures, or bridgework may not be worn into surgery.  Have a responsible adult drive you home and stay with you for 24 hours after your procedure

## 2015-12-31 ENCOUNTER — Encounter (HOSPITAL_COMMUNITY)
Admission: RE | Admit: 2015-12-31 | Discharge: 2015-12-31 | Disposition: A | Discharge status: Home / Self Care | Payer: Self-pay | Source: Ambulatory Visit | Attending: Obstetrics and Gynecology | Admitting: Obstetrics and Gynecology

## 2015-12-31 ENCOUNTER — Encounter (HOSPITAL_COMMUNITY): Payer: Self-pay

## 2015-12-31 DIAGNOSIS — N801 Endometriosis of ovary: Secondary | ICD-10-CM | POA: Insufficient documentation

## 2015-12-31 DIAGNOSIS — Z01812 Encounter for preprocedural laboratory examination: Secondary | ICD-10-CM | POA: Insufficient documentation

## 2015-12-31 DIAGNOSIS — R1031 Right lower quadrant pain: Secondary | ICD-10-CM | POA: Insufficient documentation

## 2015-12-31 HISTORY — DX: Anemia, unspecified: D64.9

## 2015-12-31 LAB — TYPE AND SCREEN
ABO/RH(D): B POS
Antibody Screen: NEGATIVE

## 2015-12-31 LAB — CBC
HCT: 32.7 % — ABNORMAL LOW (ref 36.0–46.0)
HEMOGLOBIN: 11 g/dL — AB (ref 12.0–15.0)
MCH: 30.6 pg (ref 26.0–34.0)
MCHC: 33.6 g/dL (ref 30.0–36.0)
MCV: 90.8 fL (ref 78.0–100.0)
Platelets: 291 10*3/uL (ref 150–400)
RBC: 3.6 MIL/uL — AB (ref 3.87–5.11)
RDW: 12.2 % (ref 11.5–15.5)
WBC: 4.8 10*3/uL (ref 4.0–10.5)

## 2016-01-09 NOTE — Anesthesia Preprocedure Evaluation (Addendum)
Anesthesia Evaluation  Patient identified by MRN, date of birth, ID band Patient awake    Reviewed: Allergy & Precautions, NPO status , Patient's Chart, lab work & pertinent test results  Airway Mallampati: I  TM Distance: >3 FB Neck ROM: Full    Dental  (+) Teeth Intact   Pulmonary neg pulmonary ROS,    Pulmonary exam normal breath sounds clear to auscultation       Cardiovascular negative cardio ROS Normal cardiovascular exam Rhythm:Regular Rate:Normal     Neuro/Psych negative neurological ROS  negative psych ROS   GI/Hepatic negative GI ROS, Neg liver ROS,   Endo/Other  negative endocrine ROS  Renal/GU negative Renal ROS  negative genitourinary   Musculoskeletal negative musculoskeletal ROS (+)   Abdominal   Peds  Hematology negative hematology ROS (+)   Anesthesia Other Findings   Reproductive/Obstetrics Right Ovarian cyst                              Anesthesia Physical  Anesthesia Plan  ASA: I  Anesthesia Plan: General   Post-op Pain Management:    Induction: Intravenous  Airway Management Planned: Oral ETT  Additional Equipment:   Intra-op Plan:   Post-operative Plan: Extubation in OR  Informed Consent: I have reviewed the patients History and Physical, chart, labs and discussed the procedure including the risks, benefits and alternatives for the proposed anesthesia with the patient or authorized representative who has indicated his/her understanding and acceptance.   Dental advisory given  Plan Discussed with: Anesthesiologist, CRNA and Surgeon  Anesthesia Plan Comments:         Anesthesia Quick Evaluation

## 2016-01-10 ENCOUNTER — Ambulatory Visit (HOSPITAL_COMMUNITY)
Admission: RE | Admit: 2016-01-10 | Discharge: 2016-01-10 | Disposition: A | Discharge status: Home / Self Care | Payer: Self-pay | Source: Ambulatory Visit | Attending: Obstetrics and Gynecology | Admitting: Obstetrics and Gynecology

## 2016-01-10 ENCOUNTER — Ambulatory Visit (HOSPITAL_COMMUNITY): Payer: Self-pay | Admitting: Anesthesiology

## 2016-01-10 ENCOUNTER — Encounter (HOSPITAL_COMMUNITY): Payer: Self-pay

## 2016-01-10 ENCOUNTER — Encounter (HOSPITAL_COMMUNITY)
Admission: RE | Disposition: A | Discharge status: Home / Self Care | Payer: Self-pay | Source: Ambulatory Visit | Attending: Obstetrics and Gynecology

## 2016-01-10 DIAGNOSIS — N809 Endometriosis, unspecified: Secondary | ICD-10-CM

## 2016-01-10 DIAGNOSIS — N83201 Unspecified ovarian cyst, right side: Secondary | ICD-10-CM | POA: Insufficient documentation

## 2016-01-10 DIAGNOSIS — Z91030 Bee allergy status: Secondary | ICD-10-CM | POA: Insufficient documentation

## 2016-01-10 DIAGNOSIS — N801 Endometriosis of ovary: Secondary | ICD-10-CM | POA: Insufficient documentation

## 2016-01-10 HISTORY — PX: LAPAROSCOPIC OVARIAN CYSTECTOMY: SHX6248

## 2016-01-10 LAB — PREGNANCY, URINE: PREG TEST UR: NEGATIVE

## 2016-01-10 SURGERY — EXCISION, CYST, OVARY, LAPAROSCOPIC
Anesthesia: General | Laterality: Right

## 2016-01-10 MED ORDER — SODIUM CHLORIDE 0.9 % IR SOLN
Status: DC | PRN
  Administered 2016-01-10: 1000 mL

## 2016-01-10 MED ORDER — HYDROCODONE-ACETAMINOPHEN 7.5-325 MG PO TABS
1.0000 | ORAL_TABLET | Freq: Once | ORAL | Status: AC | PRN
  Administered 2016-01-10: 1 via ORAL

## 2016-01-10 MED ORDER — LACTATED RINGERS IR SOLN
Status: DC | PRN
  Administered 2016-01-10: 3000 mL

## 2016-01-10 MED ORDER — FENTANYL CITRATE (PF) 250 MCG/5ML IJ SOLN
INTRAMUSCULAR | Status: AC
  Filled 2016-01-10: qty 5

## 2016-01-10 MED ORDER — HYDROMORPHONE HCL 1 MG/ML IJ SOLN: 0.2500 mg | INTRAMUSCULAR | Status: DC | PRN

## 2016-01-10 MED ORDER — FENTANYL CITRATE (PF) 100 MCG/2ML IJ SOLN
INTRAMUSCULAR | Status: AC
  Filled 2016-01-10: qty 2

## 2016-01-10 MED ORDER — ONDANSETRON HCL 4 MG/2ML IJ SOLN
INTRAMUSCULAR | Status: AC
  Filled 2016-01-10: qty 2

## 2016-01-10 MED ORDER — DEXAMETHASONE SODIUM PHOSPHATE 4 MG/ML IJ SOLN
INTRAMUSCULAR | Status: AC
  Filled 2016-01-10: qty 1

## 2016-01-10 MED ORDER — OXYCODONE-ACETAMINOPHEN 5-325 MG PO TABS: 1.0000 | ORAL_TABLET | ORAL | Status: DC | PRN

## 2016-01-10 MED ORDER — LIDOCAINE HCL (CARDIAC) 20 MG/ML IV SOLN
INTRAVENOUS | Status: AC
  Filled 2016-01-10: qty 5

## 2016-01-10 MED ORDER — LIDOCAINE HCL (CARDIAC) 20 MG/ML IV SOLN
INTRAVENOUS | Status: DC | PRN
  Administered 2016-01-10: 100 mg via INTRAVENOUS

## 2016-01-10 MED ORDER — HYDROCODONE-ACETAMINOPHEN 7.5-325 MG PO TABS
ORAL_TABLET | ORAL | Status: AC
  Filled 2016-01-10: qty 1

## 2016-01-10 MED ORDER — GLYCOPYRROLATE 0.2 MG/ML IJ SOLN
INTRAMUSCULAR | Status: DC | PRN
  Administered 2016-01-10: 0.6 mg via INTRAVENOUS

## 2016-01-10 MED ORDER — MIDAZOLAM HCL 2 MG/2ML IJ SOLN
INTRAMUSCULAR | Status: DC | PRN
  Administered 2016-01-10: 2 mg via INTRAVENOUS

## 2016-01-10 MED ORDER — HYDROCODONE-ACETAMINOPHEN 7.5-325 MG PO TABS: 1.0000 | ORAL_TABLET | Freq: Once | ORAL | Status: DC | PRN

## 2016-01-10 MED ORDER — MEPERIDINE HCL 25 MG/ML IJ SOLN: 6.2500 mg | INTRAMUSCULAR | Status: DC | PRN

## 2016-01-10 MED ORDER — MIDAZOLAM HCL 2 MG/2ML IJ SOLN
INTRAMUSCULAR | Status: AC
  Filled 2016-01-10: qty 2

## 2016-01-10 MED ORDER — FENTANYL CITRATE (PF) 100 MCG/2ML IJ SOLN
25.0000 ug | INTRAMUSCULAR | Status: DC | PRN
  Administered 2016-01-10 (×2): 50 ug via INTRAVENOUS

## 2016-01-10 MED ORDER — DIPHENHYDRAMINE HCL 50 MG/ML IJ SOLN
INTRAMUSCULAR | Status: AC
  Filled 2016-01-10: qty 1

## 2016-01-10 MED ORDER — SCOPOLAMINE 1 MG/3DAYS TD PT72
1.0000 | MEDICATED_PATCH | Freq: Once | TRANSDERMAL | Status: DC
  Administered 2016-01-10: 1.5 mg via TRANSDERMAL

## 2016-01-10 MED ORDER — DIPHENHYDRAMINE HCL 25 MG PO CAPS
25.0000 mg | ORAL_CAPSULE | ORAL | Status: DC | PRN
  Filled 2016-01-10: qty 1

## 2016-01-10 MED ORDER — KETOROLAC TROMETHAMINE 30 MG/ML IJ SOLN
INTRAMUSCULAR | Status: DC | PRN
  Administered 2016-01-10: 30 mg via INTRAVENOUS

## 2016-01-10 MED ORDER — IBUPROFEN 600 MG PO TABS: 600.0000 mg | ORAL_TABLET | Freq: Four times a day (QID) | ORAL | Status: DC | PRN

## 2016-01-10 MED ORDER — DOCUSATE SODIUM 100 MG PO CAPS: 100.0000 mg | ORAL_CAPSULE | Freq: Two times a day (BID) | ORAL | Status: DC | PRN

## 2016-01-10 MED ORDER — NEOSTIGMINE METHYLSULFATE 10 MG/10ML IV SOLN
INTRAVENOUS | Status: DC | PRN
  Administered 2016-01-10: 4 mg via INTRAVENOUS

## 2016-01-10 MED ORDER — SCOPOLAMINE 1 MG/3DAYS TD PT72
MEDICATED_PATCH | TRANSDERMAL | Status: AC
  Administered 2016-01-10: 1.5 mg via TRANSDERMAL
  Filled 2016-01-10: qty 1

## 2016-01-10 MED ORDER — BUPIVACAINE HCL (PF) 0.25 % IJ SOLN
INTRAMUSCULAR | Status: AC
  Filled 2016-01-10: qty 30

## 2016-01-10 MED ORDER — ROCURONIUM BROMIDE 100 MG/10ML IV SOLN
INTRAVENOUS | Status: DC | PRN
  Administered 2016-01-10: 35 mg via INTRAVENOUS

## 2016-01-10 MED ORDER — PROPOFOL 10 MG/ML IV BOLUS
INTRAVENOUS | Status: DC | PRN
  Administered 2016-01-10: 150 mg via INTRAVENOUS
  Administered 2016-01-10: 30 mg via INTRAVENOUS

## 2016-01-10 MED ORDER — FENTANYL CITRATE (PF) 100 MCG/2ML IJ SOLN
INTRAMUSCULAR | Status: DC
Start: 2016-01-10 — End: 2016-01-10
  Filled 2016-01-10: qty 2

## 2016-01-10 MED ORDER — LACTATED RINGERS IV SOLN
INTRAVENOUS | Status: DC
  Administered 2016-01-10 (×2): via INTRAVENOUS

## 2016-01-10 MED ORDER — SODIUM CHLORIDE 0.9 % IV SOLN
10000.0000 ug | INTRAVENOUS | Status: DC | PRN
  Administered 2016-01-10: 80 ug via INTRAVENOUS

## 2016-01-10 MED ORDER — KETOROLAC TROMETHAMINE 30 MG/ML IJ SOLN
INTRAMUSCULAR | Status: AC
  Filled 2016-01-10: qty 1

## 2016-01-10 MED ORDER — PROPOFOL 10 MG/ML IV BOLUS
INTRAVENOUS | Status: AC
  Filled 2016-01-10: qty 20

## 2016-01-10 MED ORDER — ROCURONIUM BROMIDE 100 MG/10ML IV SOLN
INTRAVENOUS | Status: AC
Start: 2016-01-10 — End: 2016-01-10
  Filled 2016-01-10: qty 1

## 2016-01-10 MED ORDER — ONDANSETRON HCL 4 MG/2ML IJ SOLN
INTRAMUSCULAR | Status: DC | PRN
  Administered 2016-01-10: 4 mg via INTRAVENOUS

## 2016-01-10 MED ORDER — METOCLOPRAMIDE HCL 5 MG/ML IJ SOLN: 10.0000 mg | Freq: Once | INTRAMUSCULAR | Status: DC | PRN

## 2016-01-10 MED ORDER — BUPIVACAINE HCL (PF) 0.25 % IJ SOLN
INTRAMUSCULAR | Status: DC | PRN
  Administered 2016-01-10: 10 mL

## 2016-01-10 MED ORDER — DEXAMETHASONE SODIUM PHOSPHATE 10 MG/ML IJ SOLN
INTRAMUSCULAR | Status: DC | PRN
Start: 2016-01-10 — End: 2016-01-10
  Administered 2016-01-10: 4 mg via INTRAVENOUS

## 2016-01-10 MED ORDER — FENTANYL CITRATE (PF) 100 MCG/2ML IJ SOLN
INTRAMUSCULAR | Status: DC | PRN
  Administered 2016-01-10 (×3): 50 ug via INTRAVENOUS
  Administered 2016-01-10 (×2): 100 ug via INTRAVENOUS
  Administered 2016-01-10 (×2): 50 ug via INTRAVENOUS

## 2016-01-10 MED ORDER — DIPHENHYDRAMINE HCL 50 MG/ML IJ SOLN
12.5000 mg | INTRAMUSCULAR | Status: DC | PRN
  Administered 2016-01-10: 12.5 mg via INTRAVENOUS

## 2016-01-10 SURGICAL SUPPLY — 27 items
BAG SPEC RTRVL LRG 6X4 10 (ENDOMECHANICALS)
DRSG COVADERM PLUS 2X2 (GAUZE/BANDAGES/DRESSINGS) ×4 IMPLANT
DRSG OPSITE POSTOP 3X4 (GAUZE/BANDAGES/DRESSINGS) ×1 IMPLANT
DURAPREP 26ML APPLICATOR (WOUND CARE) ×2 IMPLANT
ELECT REM PT RETURN 9FT ADLT (ELECTROSURGICAL)
ELECTRODE REM PT RTRN 9FT ADLT (ELECTROSURGICAL) IMPLANT
GLOVE BIOGEL PI IND STRL 6.5 (GLOVE) ×1 IMPLANT
GLOVE BIOGEL PI IND STRL 7.0 (GLOVE) ×1 IMPLANT
GLOVE BIOGEL PI INDICATOR 6.5 (GLOVE) ×1
GLOVE BIOGEL PI INDICATOR 7.0 (GLOVE) ×1
GLOVE SURG SS PI 6.0 STRL IVOR (GLOVE) ×2 IMPLANT
GOWN STRL REUS W/TWL LRG LVL3 (GOWN DISPOSABLE) ×4 IMPLANT
LIQUID BAND (GAUZE/BANDAGES/DRESSINGS) ×2 IMPLANT
NS IRRIG 1000ML POUR BTL (IV SOLUTION) ×2 IMPLANT
PACK LAPAROSCOPY BASIN (CUSTOM PROCEDURE TRAY) ×2 IMPLANT
PAD TRENDELENBURG POSITION (MISCELLANEOUS) ×2 IMPLANT
POUCH SPECIMEN RETRIEVAL 10MM (ENDOMECHANICALS) IMPLANT
SET IRRIG TUBING LAPAROSCOPIC (IRRIGATION / IRRIGATOR) IMPLANT
SHEARS HARMONIC ACE PLUS 36CM (ENDOMECHANICALS) ×1 IMPLANT
SLEEVE XCEL OPT CAN 5 100 (ENDOMECHANICALS) ×1 IMPLANT
SUT MNCRL AB 4-0 PS2 18 (SUTURE) ×2 IMPLANT
SUT VICRYL 0 UR6 27IN ABS (SUTURE) ×4 IMPLANT
TOWEL OR 17X24 6PK STRL BLUE (TOWEL DISPOSABLE) ×4 IMPLANT
TRAY FOLEY CATH SILVER 14FR (SET/KITS/TRAYS/PACK) ×2 IMPLANT
TROCAR BALLN 12MMX100 BLUNT (TROCAR) ×2 IMPLANT
TROCAR XCEL NON-BLD 5MMX100MML (ENDOMECHANICALS) ×1 IMPLANT
WATER STERILE IRR 1000ML POUR (IV SOLUTION) ×2 IMPLANT

## 2016-01-10 NOTE — Anesthesia Procedure Notes (Signed)
Procedure Name: Intubation Date/Time: 01/10/2016 9:48 AM Performed by: Elgie CongoMALINOVA, Mazen Marcin H Pre-anesthesia Checklist: Patient identified, Emergency Drugs available, Suction available and Patient being monitored Patient Re-evaluated:Patient Re-evaluated prior to inductionOxygen Delivery Method: Circle system utilized Preoxygenation: Pre-oxygenation with 100% oxygen Intubation Type: IV induction Ventilation: Mask ventilation without difficulty Laryngoscope Size: Mac and 3 Grade View: Grade I Tube type: Oral Tube size: 7.0 mm Number of attempts: 1 Airway Equipment and Method: Stylet Placement Confirmation: ETT inserted through vocal cords under direct vision,  positive ETCO2 and breath sounds checked- equal and bilateral Secured at: 21 cm Tube secured with: Tape Dental Injury: Teeth and Oropharynx as per pre-operative assessment

## 2016-01-10 NOTE — H&P (Signed)
Whitney Carter is an 25 y.o. female G0 presenting today for surgical management of a recurrent right ovarian endometrioma. Patient presented to the ED with RLQ pain and was noted to have an 8 cm ovarian cyst on ultrasound. Patient reports persistent RLQ pain since diagnosis. The pain is non radiating and is relieved with ibuprofen  Pertinent Gynecological History: Menses: regular every month without intermenstrual spotting Contraception: none DES exposure: denies Blood transfusions: none Previous GYN Procedures: LSC right ovarian cystectomy  Last pap: abnormal: HGSIL  Date: 11/2014 s/p LEEP 01/2015    Menstrual History: Patient's last menstrual period was 12/10/2015.    Past Medical History  Diagnosis Date  . Dislocation of right shoulder joint   . Scaphoid fracture, wrist, closed     right  . Anemia     CHILDHOOD    Past Surgical History  Procedure Laterality Date  . Laparoscopic ovarian cystectomy Right 05/18/2015    Procedure: LAPAROSCOPIC OVARIAN CYSTECTOMY;  Surgeon: Catalina Antigua, MD;  Location: [facility];  Service: Gynecology;  Laterality: Right;  Requested 05-18-15 @ 7:30a    Family History  Problem Relation Age of Onset  . Hypertension Mother   . Hypertension Maternal Grandmother   . Diabetes Maternal Grandmother   . Hyperlipidemia Maternal Grandmother     Social History:  reports that she has never smoked. She does not have any smokeless tobacco history on file. She reports that she drinks alcohol. She reports that she does not use illicit drugs.  Allergies:  Allergies  Allergen Reactions  . Bee Venom Swelling    Prescriptions prior to admission  Medication Sig Dispense Refill Last Dose  . acetaminophen (TYLENOL) 500 MG tablet Take 1,000 mg by mouth every 6 (six) hours as needed for moderate pain.   Past Month at Unknown time  . docusate sodium (COLACE) 100 MG capsule Take 1 capsule (100 mg total) by mouth 2 (two) times daily as needed. (Patient not taking:  Reported on 11/09/2015) 30 capsule 2 Not Taking  . ibuprofen (ADVIL,MOTRIN) 600 MG tablet Take 1 tablet (600 mg total) by mouth every 6 (six) hours as needed. (Patient not taking: Reported on 11/21/2015) 30 tablet 0 Not Taking  . oxyCODONE-acetaminophen (PERCOCET/ROXICET) 5-325 MG tablet Take 2 tablets by mouth every 4 (four) hours as needed for severe pain. (Patient not taking: Reported on 11/21/2015) 6 tablet 0 Not Taking    Review of Systems  All other systems reviewed and are negative.   Blood pressure 108/70, pulse 87, temperature 98.1 F (36.7 C), temperature source Oral, resp. rate 16, last menstrual period 12/10/2015, SpO2 100 %. Physical Exam GENERAL: Well-developed, well-nourished female in no acute distress.  HEENT: Normocephalic, atraumatic. Sclerae anicteric.  NECK: Supple. Normal thyroid.  LUNGS: Clear to auscultation bilaterally.  HEART: Regular rate and rhythm. ABDOMEN: Soft, nontender, nondistended. No organomegaly. PELVIC: Deferred to OR. EXTREMITIES: No cyanosis, clubbing, or edema, 2+ distal pulses.  Results for orders placed or performed during the hospital encounter of 01/10/16 (from the past 24 hour(s))  Pregnancy, urine     Status: None   Collection Time: 01/10/16  8:55 AM  Result Value Ref Range   Preg Test, Ur NEGATIVE NEGATIVE    No results found. 11/2015 ultrasound FINDINGS: Uterus  Measurements: 9 x 4 x 4 cm. No fibroids or other mass visualized.  Endometrium  Thickness: 9 mm. Minimal internal fluid. No focal abnormality visualized.  Right ovary  Measurements: 85 x 61 x 79 mm. Asymmetric enlargement is secondary to a  7 cm mass with homogeneous mid level echoes and single thin septation. There is no internal blood flow.  Left ovary  Measurements: 22 x 12 x 13 mm. Normal appearance/no adnexal mass.  Pulsed Doppler evaluation of both ovaries demonstrates normal low-resistance arterial and venous waveforms.  Other findings  No  abnormal free fluid.  IMPRESSION: 1. No acute finding. No indication of ovarian torsion. 2. Recurrent 7 cm right adnexal mass consistent with endometrioma.  Assessment/Plan: 25 yo G0 with recurrent right ovarian endometrioma here for surgical evaluation - Risks, benefits and alternatives were explained including but not limited to risks of bleeding, infection, damage to adjacent organs, possible laparotomy and possible oophorectomy. Patient verbalized understanding and all questions were answered.   Jerrion Tabbert 01/10/2016, 9:25 AM

## 2016-01-10 NOTE — Op Note (Addendum)
PROCEDURE DATE: 05/11/2015  PREOPERATIVE DIAGNOSES: Right ovarian cyst POSTOPERATIVE DIAGNOSES: right ovarian endometrioma and endometriotic implants throughout the pelvis PROCEDURE: Laparoscopic right ovarian cystectomy SURGEON:  Dr. Catalina AntiguaPeggy Valoree Agent ASSISTANT: None   INDICATIONS: 25 y.o. G0 with a right ovarian endometrioma on ultrasound here today for definitive surgical management.   Risks of surgery were discussed with the patient including but not limited to: bleeding which may require transfusion or reoperation; infection which may require antibiotics; injury to bowel, bladder, ureters or other surrounding organs; need for additional procedures including laparotomy; thromboembolic phenomenon, incisional problems and other postoperative/anesthesia complications. Written informed consent was obtained.    FINDINGS:  Small uterus, right adnexa with 7 cm right ovarian cyst. Normal left adnexa.  Evidence of endometriosis visualized with implants on uterine fundus, cul de sac, right fallopian tube involving fimbriated end. No adhesions or any other abdominal/pelvic abnormality.  Normal upper abdomen.  ANESTHESIA:    General INTRAVENOUS FLUIDS: 1500 ml ESTIMATED BLOOD LOSS: 10 ml URINE OUTPUT: 200 ml SPECIMENS:  Right ovarian cyst wall COMPLICATIONS: None immediate   PROCEDURE IN DETAIL:  The patient was taken to the operating room where general anesthesia was administered and was found to be adequate.  She was placed in the dorsal lithotomy position, and was prepped and draped in a sterile manner.  A Foley catheter was inserted into her bladder and attached to Randi Poullard drainage and a uterine manipulator was then advanced into the uterus .  After an adequate timeout was performed, attention was then turned to the patient's abdomen where a 11-mm skin incision was made in the umbilical fold, over the previous incision.  The fascia was identified, grasped with Kocher clamps, incised and tagged with  0-Vicryl.  The 11-mm trocar and sleeve were then advanced without difficulty into the abdomen and intraabdominal placement was confirmed by the laparoscope. A survey of the patient's pelvis and abdomen revealed the above findings.   Two 5-mm ports were placed under direct visualization: the first one was placed in the left lower quadrant over the previous incision, 2 cm above and medial to the superior iliac spine; the second port was placed 5 cm cephalad to the first one. Attention was turned to the right ovary which was mobilized out of the pelvis. Drainage of the right ovarian cyst occurred while elevated the right ovary, revealing its chocolate-like content. The cyst was carefully dissected using the harmonic scalpel. The ovarian bed was then irrigated and hemostasis was obtained using electrocautery. The cyst wall was removed through the 11-mm port without difficulty. At this time, the pelvis was again copiously irrigated. Hemostasis was assured. No intraoperative injury to other surrounding organs was noted.  The abdomen was desufflated and all instruments were then removed from the patient's abdomen. The fascial incision of the umbilicus was closed with a 0 Vicryl figure of eight stitch.  All skin incisions were closed with 3-0 Vicryl subcuticular stitches/Dermabond.   The patient will be discharged to home as per PACU criteria.  Routine postoperative instructions given.  She was prescribed Percocet, Ibuprofen and Colace.  She will follow up in the clinic in 2 weeks for postoperative evaluation.

## 2016-01-10 NOTE — Anesthesia Postprocedure Evaluation (Signed)
Anesthesia Post Note  Patient: Whitney Carter  Procedure(s) Performed: Procedure(s) (LRB): LAPAROSCOPIC RIGHT OVARIAN CYSTECTOMY (Right)  Patient location during evaluation: PACU Anesthesia Type: General Level of consciousness: awake and alert and oriented Pain management: pain level controlled Vital Signs Assessment: post-procedure vital signs reviewed and stable Respiratory status: spontaneous breathing, nonlabored ventilation and respiratory function stable Cardiovascular status: blood pressure returned to baseline and stable Postop Assessment: no signs of nausea or vomiting Anesthetic complications: no     Last Vitals:  Filed Vitals:   01/10/16 1145 01/10/16 1200  BP: 110/68 102/59  Pulse: 78 75  Temp:  36.8 C  Resp: 17 16    Last Pain:  Filed Vitals:   01/10/16 1206  PainSc: 0-No pain   Pain Goal: Patients Stated Pain Goal: 3 (01/10/16 1130)               Sir Mallis A.

## 2016-01-10 NOTE — Transfer of Care (Signed)
Immediate Anesthesia Transfer of Care Note  Patient: Whitney Carter  Procedure(s) Performed: Procedure(s): LAPAROSCOPIC RIGHT OVARIAN CYSTECTOMY (Right)  Patient Location: PACU  Anesthesia Type:General  Level of Consciousness: awake, alert  and oriented  Airway & Oxygen Therapy: Patient Spontanous Breathing and Patient connected to nasal cannula oxygen  Post-op Assessment: Report given to RN and Post -op Vital signs reviewed and stable  Post vital signs: Reviewed and stable  Last Vitals:  Filed Vitals:   01/10/16 0904  BP: 108/70  Pulse: 87  Temp: 36.7 C  Resp: 16    Last Pain: There were no vitals filed for this visit.    Patients Stated Pain Goal: 3 (01/10/16 0904)  Complications: No apparent anesthesia complications

## 2016-01-10 NOTE — Discharge Instructions (Signed)
Contraception Choices Contraception (birth control) is the use of any methods or devices to prevent pregnancy. Below are some methods to help avoid pregnancy. HORMONAL METHODS   Contraceptive implant. This is a thin, plastic tube containing progesterone hormone. It does not contain estrogen hormone. Your health care provider inserts the tube in the inner part of the upper arm. The tube can remain in place for up to 3 years. After 3 years, the implant must be removed. The implant prevents the ovaries from releasing an egg (ovulation), thickens the cervical mucus to prevent sperm from entering the uterus, and thins the lining of the inside of the uterus.  Progesterone-only injections. These injections are given every 3 months by your health care provider to prevent pregnancy. This synthetic progesterone hormone stops the ovaries from releasing eggs. It also thickens cervical mucus and changes the uterine lining. This makes it harder for sperm to survive in the uterus.  Birth control pills. These pills contain estrogen and progesterone hormone. They work by preventing the ovaries from releasing eggs (ovulation). They also cause the cervical mucus to thicken, preventing the sperm from entering the uterus. Birth control pills are prescribed by a health care provider.Birth control pills can also be used to treat heavy periods.  Minipill. This type of birth control pill contains only the progesterone hormone. They are taken every day of each month and must be prescribed by your health care provider.  Birth control patch. The patch contains hormones similar to those in birth control pills. It must be changed once a week and is prescribed by a health care provider.  Vaginal ring. The ring contains hormones similar to those in birth control pills. It is left in the vagina for 3 weeks, removed for 1 week, and then a new one is put back in place. The patient must be comfortable inserting and removing the ring  from the vagina.A health care provider's prescription is necessary.  Emergency contraception. Emergency contraceptives prevent pregnancy after unprotected sexual intercourse. This pill can be taken right after sex or up to 5 days after unprotected sex. It is most effective the sooner you take the pills after having sexual intercourse. Most emergency contraceptive pills are available without a prescription. Check with your pharmacist. Do not use emergency contraception as your only form of birth control. BARRIER METHODS   Female condom. This is a thin sheath (latex or rubber) that is worn over the penis during sexual intercourse. It can be used with spermicide to increase effectiveness.  Female condom. This is a soft, loose-fitting sheath that is put into the vagina before sexual intercourse.  Diaphragm. This is a soft, latex, dome-shaped barrier that must be fitted by a health care provider. It is inserted into the vagina, along with a spermicidal jelly. It is inserted before intercourse. The diaphragm should be left in the vagina for 6 to 8 hours after intercourse.  Cervical cap. This is a round, soft, latex or plastic cup that fits over the cervix and must be fitted by a health care provider. The cap can be left in place for up to 48 hours after intercourse.  Sponge. This is a soft, circular piece of polyurethane foam. The sponge has spermicide in it. It is inserted into the vagina after wetting it and before sexual intercourse.  Spermicides. These are chemicals that kill or block sperm from entering the cervix and uterus. They come in the form of creams, jellies, suppositories, foam, or tablets. They do not require a  prescription. They are inserted into the vagina with an applicator before having sexual intercourse. The process must be repeated every time you have sexual intercourse. INTRAUTERINE CONTRACEPTION  Intrauterine device (IUD). This is a T-shaped device that is put in a woman's uterus  during a menstrual period to prevent pregnancy. There are 2 types:  Copper IUD. This type of IUD is wrapped in copper wire and is placed inside the uterus. Copper makes the uterus and fallopian tubes produce a fluid that kills sperm. It can stay in place for 10 years.  Hormone IUD. This type of IUD contains the hormone progestin (synthetic progesterone). The hormone thickens the cervical mucus and prevents sperm from entering the uterus, and it also thins the uterine lining to prevent implantation of a fertilized egg. The hormone can weaken or kill the sperm that get into the uterus. It can stay in place for 3-5 years, depending on which type of IUD is used. PERMANENT METHODS OF CONTRACEPTION  Female tubal ligation. This is when the woman's fallopian tubes are surgically sealed, tied, or blocked to prevent the egg from traveling to the uterus.  Hysteroscopic sterilization. This involves placing a small coil or insert into each fallopian tube. Your doctor uses a technique called hysteroscopy to do the procedure. The device causes scar tissue to form. This results in permanent blockage of the fallopian tubes, so the sperm cannot fertilize the egg. It takes about 3 months after the procedure for the tubes to become blocked. You must use another form of birth control for these 3 months.  Female sterilization. This is when the female has the tubes that carry sperm tied off (vasectomy).This blocks sperm from entering the vagina during sexual intercourse. After the procedure, the man can still ejaculate fluid (semen). NATURAL PLANNING METHODS  Natural family planning. This is not having sexual intercourse or using a barrier method (condom, diaphragm, cervical cap) on days the woman could become pregnant.  Calendar method. This is keeping track of the length of each menstrual cycle and identifying when you are fertile.  Ovulation method. This is avoiding sexual intercourse during ovulation.  Symptothermal  method. This is avoiding sexual intercourse during ovulation, using a thermometer and ovulation symptoms.  Post-ovulation method. This is timing sexual intercourse after you have ovulated. Regardless of which type or method of contraception you choose, it is important that you use condoms to protect against the transmission of sexually transmitted infections (STIs). Talk with your health care provider about which form of contraception is most appropriate for you.   This information is not intended to replace advice given to you by your health care provider. Make sure you discuss any questions you have with your health care provider.   Document Released: 07/24/2005 Document Revised: 07/29/2013 Document Reviewed: 01/16/2013 Elsevier Interactive Patient Education [employer] Endometriosis Endometriosis is a condition in which the tissue that lines the uterus (endometrium) grows outside of its normal location. The tissue may grow in many locations close to the uterus, but it commonly grows on the ovaries, fallopian tubes, vagina, or bowel. Because the uterus expels, or sheds, its lining every menstrual cycle, there is bleeding wherever the endometrial tissue is located. This can cause pain because blood is irritating to tissues not normally exposed to it.  CAUSES  The cause of endometriosis is not known.  SIGNS AND SYMPTOMS  Often, there are no symptoms. When symptoms are present, they can vary with the location of the displaced tissue. Various symptoms can occur  at different times. Although symptoms occur mainly during a woman's menstrual period, they can also occur midcycle and usually stop with menopause. Some people may go months with no symptoms at all. Symptoms may include:   Back or abdominal pain.   Heavier bleeding during periods.   Pain during intercourse.   Painful bowel movements.   Infertility. DIAGNOSIS  Your health care provider will do a physical exam and ask about  your symptoms. Various tests may be done, such as:   Blood tests and urine tests. These are done to help rule out other problems.   Ultrasound. This test is done to look for abnormal tissue.   An X-ray of the lower bowel (barium enema).  Laparoscopy. In this procedure, a thin, lighted tube with a tiny camera on the end (laparoscope) is inserted into your abdomen. This helps your health care provider look for abnormal tissue to confirm the diagnosis. The health care provider may also remove a small piece of tissue (biopsy) from any abnormal tissue found. This tissue sample can then be sent to a lab so it can be looked at under a microscope. TREATMENT  Treatment will vary and may include:   Medicines to relieve pain. Nonsteroidal anti-inflammatory drugs (NSAIDs) are a type of pain medicine that can help to relieve the pain caused by endometriosis.  Hormonal therapy. When using hormonal therapy, periods are eliminated. This eliminates the monthly exposure to blood by the displaced endometrial tissue.   Surgery. Surgery may sometimes be done to remove the abnormal endometrial tissue. In severe cases, surgery may be done to remove the fallopian tubes, uterus, and ovaries (hysterectomy). HOME CARE INSTRUCTIONS   Take all medicines as directed by your health care provider. Do not take aspirin because it may increase bleeding when you are not on hormonal therapy.   Avoid activities that produce pain, including sexual activity. SEEK MEDICAL CARE IF:  You have pelvic pain before, after, or during your periods.  You have pelvic pain between periods that gets worse during your period.  You have pelvic pain during or after sex.  You have pelvic pain with bowel movements or urination, especially during your period.  You have problems getting pregnant.  You have a fever. SEEK IMMEDIATE MEDICAL CARE IF:   Your pain is severe and is not responding to pain medicine.   You have severe  nausea and vomiting, or you cannot keep foods down.   You have pain that is limited to the right lower part of your abdomen.   You have swelling or increasing pain in your abdomen.   You see blood in your stool.  MAKE SURE YOU:   Understand these instructions.  Will watch your condition.  Will get help right away if you are not doing well or get worse.   This information is not intended to replace advice given to you by your health care provider. Make sure you discuss any questions you have with your health care provider.   Document Released: 07/21/2000 Document Revised: 08/14/2014 Document Reviewed: 03/21/2013 Elsevier Interactive Patient Education 2016 [employer] Diagnostic Laparoscopy A diagnostic laparoscopy is a procedure to diagnose diseases in the abdomen. During the procedure, a thin, lighted, pencil-sized instrument called a laparoscope is inserted into the abdomen through an incision. The laparoscope allows your health care provider to look at the organs inside your body. LET [facility] Center[facility]OUR HEALTH CARE PROVIDER KNOW ABOUT:  Any allergies you have.  All medicines you are taking, including vitamins, herbs, eye  drops, creams, and over-the-counter medicines.  Previous problems you or members of your family have had with the use of anesthetics.  Any blood disorders you have.  Previous surgeries you have had.  Medical conditions you have. RISKS AND COMPLICATIONS  Generally, this is a safe procedure. However, problems can occur, which may include:  Infection.  Bleeding.  Damage to other organs.  Allergic reaction to the anesthetics used during the procedure. BEFORE THE PROCEDURE  Do not eat or drink anything after midnight on the night before the procedure or as directed by your health care provider.  Ask your health care provider about:  Changing or stopping your regular medicines.  Taking medicines such as aspirin and ibuprofen. These medicines can thin your  blood. Do not take these medicines before your procedure if your health care provider instructs you not to.  Plan to have someone take you home after the procedure. PROCEDURE  You may be given a medicine to help you relax (sedative).  You will be given a medicine to make you sleep (general anesthetic).  Your abdomen will be inflated with a gas. This will make your organs easier to see.  Small incisions will be made in your abdomen.  A laparoscope and other small instruments will be inserted into the abdomen through the incisions.  A tissue sample may be removed from an organ in the abdomen for examination.  The instruments will be removed from the abdomen.  The gas will be released.  The incisions will be closed with stitches (sutures). AFTER THE PROCEDURE  Your blood pressure, heart rate, breathing rate, and blood oxygen level will be monitored often until the medicines you were given have worn off.   This information is not intended to replace advice given to you by your health care provider. Make sure you discuss any questions you have with your health care provider.   Document Released: 10/30/2000 Document Revised: 04/14/2015 Document Reviewed: 03/06/2014 Elsevier Interactive Patient Education 2016 [employer] Inc[employer]  DISCHARGE INSTRUCTIONS: Laparoscopy  The following instructions have been prepared to help you care for yourself upon your return home today.  Wound care:  Do not get the incision wet for the first 24 hours. The incision should be kept clean and dry.  The Band-Aids or dressings may be removed the day after surgery.  Should the incision become sore, red, and swollen after the first week, check with your doctor.  Personal hygiene:  Shower the day after your procedure.  Activity and limitations:  Do NOT drive or operate any equipment today.  Do NOT lift anything more than 15 pounds for 2-3 weeks after surgery.  Do NOT rest in bed all day.  Walking is  encouraged. Walk each day, starting slowly with 5-minute walks 3 or 4 times a day. Slowly increase the length of your walks.  Walk up and down stairs slowly.  Do NOT do strenuous activities, such as golfing, playing tennis, bowling, running, biking, weight lifting, gardening, mowing, or vacuuming for 2-4 weeks. Ask your doctor when it is okay to start.  Diet: Eat a light meal as desired this evening. You may resume your usual diet tomorrow.  Return to work: This is dependent on the type of work you do. For the most part you can return to a desk job within a week of surgery. If you are more active at work, please discuss this with your doctor.  What to expect after your surgery: You may have a slight burning sensation when  you urinate on the first day. You may have a very small amount of blood in the urine. Expect to have a small amount of vaginal discharge/light bleeding for 1-2 weeks. It is not unusual to have abdominal soreness and bruising for up to 2 weeks. You may be tired and need more rest for about 1 week. You may experience shoulder pain for 24-72 hours. Lying flat in bed may relieve it.  You may remove the patch behind your left ear on or before 01/13/16.  Wash your hands with soap and water after contact with the patch.   Do not take ibuprofen/Advil/Motrin products until 4:45pm 01/10/16.  Call your doctor for any of the following:  Develop a fever of 100.4 or greater  Inability to urinate 6 hours after discharge from hospital  Severe pain not relieved by pain medications  Persistent of heavy bleeding at incision site  Redness or swelling around incision site after a week  Increasing nausea or vomiting  Patient Signature________________________________________ Nurse Signature_________________________________________  Recovery Unit- [id number]

## 2016-01-11 ENCOUNTER — Encounter (HOSPITAL_COMMUNITY): Payer: Self-pay | Admitting: Obstetrics and Gynecology

## 2016-01-18 ENCOUNTER — Telehealth: Payer: Self-pay | Admitting: General Practice

## 2016-01-18 NOTE — Telephone Encounter (Signed)
Patient called and left message stating she is calling for a work release letter & had surgery last week. Called patient stating I am returning her call. Told patient Dr Jolayne Pantheronstant is not in the office today but I would message her and let her know you have interest in returning to work & when we hear from her someone will call you back. Patient verbalized understanding & had no questions at this time

## 2016-01-19 ENCOUNTER — Telehealth: Payer: Self-pay | Admitting: General Practice

## 2016-01-19 ENCOUNTER — Encounter: Payer: Self-pay | Admitting: General Practice

## 2016-01-19 ENCOUNTER — Encounter: Payer: Self-pay | Admitting: *Deleted

## 2016-01-19 NOTE — Telephone Encounter (Signed)
Per Dr Jolayne Pantheronstant, patient may have return to work letter but needs to come for post op. Letter created and placed in box in front office. Called patient, no answer- left message to call us back at the clinics

## 2016-01-31 NOTE — Telephone Encounter (Signed)
I have attempted to reach patient but there was no answer

## 2016-02-01 NOTE — Telephone Encounter (Signed)
Patient has a appt scheduled for 02/03/2016 she can pick her work note up then.

## 2016-02-03 ENCOUNTER — Encounter: Payer: Self-pay | Admitting: Obstetrics and Gynecology

## 2016-02-03 ENCOUNTER — Ambulatory Visit (INDEPENDENT_AMBULATORY_CARE_PROVIDER_SITE_OTHER): Payer: Self-pay | Admitting: Obstetrics and Gynecology

## 2016-02-03 VITALS — BP 114/64 | HR 89 | Wt 137.3 lb

## 2016-02-03 DIAGNOSIS — Z9889 Other specified postprocedural states: Secondary | ICD-10-CM

## 2016-02-03 MED ORDER — NORGESTIMATE-ETH ESTRADIOL 0.25-35 MG-MCG PO TABS: 1.0000 | ORAL_TABLET | Freq: Every day | ORAL | Status: DC

## 2016-02-03 NOTE — Patient Instructions (Signed)
Endometriosis Endometriosis is a condition in which the tissue that lines the uterus (endometrium) grows outside of its normal location. The tissue may grow in many locations close to the uterus, but it commonly grows on the ovaries, fallopian tubes, vagina, or bowel. Because the uterus expels, or sheds, its lining every menstrual cycle, there is bleeding wherever the endometrial tissue is located. This can cause pain because blood is irritating to tissues not normally exposed to it.  CAUSES  The cause of endometriosis is not known.  SIGNS AND SYMPTOMS  Often, there are no symptoms. When symptoms are present, they can vary with the location of the displaced tissue. Various symptoms can occur at different times. Although symptoms occur mainly during a woman's menstrual period, they can also occur midcycle and usually stop with menopause. Some people may go months with no symptoms at all. Symptoms may include:   Back or abdominal pain.   Heavier bleeding during periods.   Pain during intercourse.   Painful bowel movements.   Infertility. DIAGNOSIS  Your health care provider will do a physical exam and ask about your symptoms. Various tests may be done, such as:   Blood tests and urine tests. These are done to help rule out other problems.   Ultrasound. This test is done to look for abnormal tissue.   An X-ray of the lower bowel (barium enema).  Laparoscopy. In this procedure, a thin, lighted tube with a tiny camera on the end (laparoscope) is inserted into your abdomen. This helps your health care provider look for abnormal tissue to confirm the diagnosis. The health care provider may also remove a small piece of tissue (biopsy) from any abnormal tissue found. This tissue sample can then be sent to a lab so it can be looked at under a microscope. TREATMENT  Treatment will vary and may include:   Medicines to relieve pain. Nonsteroidal anti-inflammatory drugs (NSAIDs) are a type of  pain medicine that can help to relieve the pain caused by endometriosis.  Hormonal therapy. When using hormonal therapy, periods are eliminated. This eliminates the monthly exposure to blood by the displaced endometrial tissue.   Surgery. Surgery may sometimes be done to remove the abnormal endometrial tissue. In severe cases, surgery may be done to remove the fallopian tubes, uterus, and ovaries (hysterectomy). HOME CARE INSTRUCTIONS   Take all medicines as directed by your health care provider. Do not take aspirin because it may increase bleeding when you are not on hormonal therapy.   Avoid activities that produce pain, including sexual activity. SEEK MEDICAL CARE IF:  You have pelvic pain before, after, or during your periods.  You have pelvic pain between periods that gets worse during your period.  You have pelvic pain during or after sex.  You have pelvic pain with bowel movements or urination, especially during your period.  You have problems getting pregnant.  You have a fever. SEEK IMMEDIATE MEDICAL CARE IF:   Your pain is severe and is not responding to pain medicine.   You have severe nausea and vomiting, or you cannot keep foods down.   You have pain that is limited to the right lower part of your abdomen.   You have swelling or increasing pain in your abdomen.   You see blood in your stool.  MAKE SURE YOU:   Understand these instructions.  Will watch your condition.  Will get help right away if you are not doing well or get worse.   This information   is not intended to replace advice given to you by your health care provider. Make sure you discuss any questions you have with your health care provider.   Document Released: 07/21/2000 Document Revised: 08/14/2014 Document Reviewed: 03/21/2013 Elsevier Interactive Patient Education 2016 [employer] Inc[employer]  

## 2016-02-03 NOTE — Progress Notes (Signed)
25 yo G0 presenting today for post op check s/p laparoscopic right ovarian cystectomy on 01/10/2016. Patient had a second endometrioma removed and several endometriotic implants visualized throughout her pelvis. Patient reports feeling well. She denies any abdominal pain or fevers.   Past Medical History  Diagnosis Date  . Dislocation of right shoulder joint   . Scaphoid fracture, wrist, closed     right  . Anemia     CHILDHOOD   Past Surgical History  Procedure Laterality Date  . Laparoscopic ovarian cystectomy Right 05/18/2015    Procedure: LAPAROSCOPIC OVARIAN CYSTECTOMY;  Surgeon: Catalina AntiguaPeggy Kaenan Jake, MD;  Location: [facility];  Service: Gynecology;  Laterality: Right;  Requested 05-18-15 @ 7:30a  . Laparoscopic ovarian cystectomy Right 01/10/2016    Procedure: LAPAROSCOPIC RIGHT OVARIAN CYSTECTOMY;  Surgeon: Catalina AntiguaPeggy Barett Whidbee, MD;  Location: [facility];  Service: Gynecology;  Laterality: Right;   Family History  Problem Relation Age of Onset  . Hypertension Mother   . Hypertension Maternal Grandmother   . Diabetes Maternal Grandmother   . Hyperlipidemia Maternal Grandmother    Social History  Substance Use Topics  . Smoking status: Never Smoker   . Smokeless tobacco: None  . Alcohol Use: Yes     Comment: socially   ROS See pertinent in HPI  Blood pressure 114/64, pulse 89, weight 137 lb 4.8 oz (62.279 kg), last menstrual period 01/11/2016. GENERAL: Well-developed, well-nourished female in no acute distress.  ABDOMEN: Soft, nontender, nondistended. No organomegaly. Incision: no erythema, induration. Left port site draining serous fluid. No tenderness. No palpable abscess. PELVIC: Not performed EXTREMITIES: No cyanosis, clubbing, or edema, 2+ distal pulses.  A/P 25 yo here for post op check - Informed patient of diagnosis of endometriosis - Discussed medical management with contraception which suppresses ovulation. Patient opted for OCP.  - patient is medically cleared to resume all  activities of daily living - Advised to apply bacitracin ointment to incision twice daily - RTC prn

## 2016-02-12 ENCOUNTER — Ambulatory Visit (HOSPITAL_COMMUNITY)
Admission: EM | Admit: 2016-02-12 | Discharge: 2016-02-12 | Disposition: A | Discharge status: Home / Self Care | Payer: Medicaid Other | Attending: Radiology | Admitting: Radiology

## 2016-02-12 ENCOUNTER — Encounter (HOSPITAL_COMMUNITY): Payer: Self-pay | Admitting: Family Medicine

## 2016-02-12 DIAGNOSIS — R21 Rash and other nonspecific skin eruption: Secondary | ICD-10-CM

## 2016-02-12 MED ORDER — METHYLPREDNISOLONE SODIUM SUCC 125 MG IJ SOLR
125.0000 mg | Freq: Once | INTRAMUSCULAR | Status: AC
  Administered 2016-02-12: 125 mg via INTRAVENOUS

## 2016-02-12 MED ORDER — METHYLPREDNISOLONE SODIUM SUCC 125 MG IJ SOLR
INTRAMUSCULAR | Status: AC
  Filled 2016-02-12: qty 2

## 2016-02-12 NOTE — Discharge Instructions (Signed)
Take benadryl 2 tablets as directed on the back of the box

## 2016-02-12 NOTE — ED Provider Notes (Signed)
CSN: [id number]     Arrival date & time 02/12/16  1731 History   None    Chief Complaint  Patient presents with  . Rash   (Consider location/radiation/quality/duration/timing/severity/associated sxs/prior Treatment) HPI Comments: Patient presents with a pruritic rash to face, neck and bilateral arms after coming in contact with something at work. Rash is described as "itchy"  Patient reports taking  of benadryl with little effect. Condition is made better by nothing. Condition is made worse by nothing.  Patient is a 25 y.o. female presenting with rash. The history is provided by the patient.  Rash Location:  Head/neck and shoulder/arm Head/neck rash location:  Head Quality: itchiness and redness   Onset quality:  Gradual Duration:  4 days Timing:  Constant Progression:  Spreading Chronicity:  New Context comment:  Something at work   Past Medical History  Diagnosis Date  . Dislocation of right shoulder joint   . Scaphoid fracture, wrist, closed     right  . Anemia     CHILDHOOD   Past Surgical History  Procedure Laterality Date  . Laparoscopic ovarian cystectomy Right 05/18/2015    Procedure: LAPAROSCOPIC OVARIAN CYSTECTOMY;  Surgeon: Catalina Antigua, MD;  Location: [facility];  Service: Gynecology;  Laterality: Right;  Requested 05-18-15 @ 7:30a  . Laparoscopic ovarian cystectomy Right 01/10/2016    Procedure: LAPAROSCOPIC RIGHT OVARIAN CYSTECTOMY;  Surgeon: Catalina Antigua, MD;  Location: [facility];  Service: Gynecology;  Laterality: Right;   Family History  Problem Relation Age of Onset  . Hypertension Mother   . Hypertension Maternal Grandmother   . Diabetes Maternal Grandmother   . Hyperlipidemia Maternal Grandmother    Social History  Substance Use Topics  . Smoking status: Never Smoker   . Smokeless tobacco: None  . Alcohol Use: Yes     Comment: socially   OB History    Gravida Para Term Preterm AB TAB SAB Ectopic Multiple Living        Review of Systems  Constitutional: Negative.   Skin: Positive for rash.    Allergies  Bee venom  Home Medications   Prior to Admission medications   Medication Sig Start Date End Date Taking? Authorizing Provider  docusate sodium (COLACE) 100 MG capsule Take 1 capsule (100 mg total) by mouth 2 (two) times daily as needed. 01/10/16   Peggy Constant, MD  ibuprofen (ADVIL,MOTRIN) 600 MG tablet Take 1 tablet (600 mg total) by mouth every 6 (six) hours as needed. 01/10/16   Peggy Constant, MD  norgestimate-ethinyl estradiol (ORTHO-CYCLEN,SPRINTEC,PREVIFEM) 0.25-35 MG-MCG tablet Take 1 tablet by mouth daily. 02/03/16   Peggy Constant, MD  oxyCODONE-acetaminophen (PERCOCET/ROXICET) 5-325 MG tablet Take 1 tablet by mouth every 4 (four) hours as needed. Patient not taking: Reported on 02/03/2016 01/10/16   Catalina Antigua, MD   Meds Ordered and Administered this Visit   Medications  methylPREDNISolone sodium succinate (SOLU-MEDROL) 125 mg/2 mL injection 125 mg (not administered)    BP 125/80 mmHg  Pulse 85  Temp(Src) 98 F (36.7 C)  Resp 18  SpO2 100%  LMP 01/11/2016 No data found.   Physical Exam  Constitutional: She is oriented to person, place, and time. She appears well-developed and well-nourished.  Neurological: She is alert and oriented to person, place, and time.  Skin: Skin is dry. Rash (pruirtic rash with erytehma noted to face, neck and bilateral arms) noted.    ED Course  Procedures (including critical care time)  Labs Review Labs Reviewed - No data to display  Imaging Review No results found.   Visual Acuity Review  Right Eye Distance:   Left Eye Distance:   Bilateral Distance:    Right Eye Near:   Left Eye Near:    Bilateral Near:         MDM   1. Rash    Take benadryl 2 tablets as directed on the back of the box and followup with primary care if not improved    Alene MiresJennifer C Amaan Meyer, NP 02/12/16 1908

## 2016-02-12 NOTE — ED Notes (Signed)
Pt here for possible allergic reaction to something. Pt has hives to face back and arms. sts she has been using benadryl with no relief. Denies any new use of lotions, foods, detergents.

## 2016-09-20 ENCOUNTER — Inpatient Hospital Stay (HOSPITAL_COMMUNITY)
Admission: AD | Admit: 2016-09-20 | Discharge: 2016-09-20 | Discharge status: Against Medical Advice | Payer: Self-pay | Source: Ambulatory Visit | Attending: Obstetrics and Gynecology | Admitting: Obstetrics and Gynecology

## 2016-09-20 ENCOUNTER — Emergency Department (HOSPITAL_COMMUNITY)
Admission: EM | Admit: 2016-09-20 | Discharge: 2016-09-20 | Disposition: A | Discharge status: Home / Self Care | Payer: Self-pay | Attending: Emergency Medicine | Admitting: Emergency Medicine

## 2016-09-20 ENCOUNTER — Encounter (HOSPITAL_COMMUNITY): Payer: Self-pay | Admitting: Emergency Medicine

## 2016-09-20 ENCOUNTER — Encounter (HOSPITAL_COMMUNITY): Payer: Self-pay | Admitting: Family Medicine

## 2016-09-20 DIAGNOSIS — R1110 Vomiting, unspecified: Secondary | ICD-10-CM | POA: Insufficient documentation

## 2016-09-20 DIAGNOSIS — R112 Nausea with vomiting, unspecified: Secondary | ICD-10-CM | POA: Insufficient documentation

## 2016-09-20 DIAGNOSIS — Z5321 Procedure and treatment not carried out due to patient leaving prior to being seen by health care provider: Secondary | ICD-10-CM | POA: Insufficient documentation

## 2016-09-20 DIAGNOSIS — Z3202 Encounter for pregnancy test, result negative: Secondary | ICD-10-CM | POA: Insufficient documentation

## 2016-09-20 HISTORY — DX: Unspecified ovarian cyst, left side: N83.202

## 2016-09-20 HISTORY — DX: Unspecified ovarian cyst, right side: N83.201

## 2016-09-20 LAB — CBC
HEMATOCRIT: 34.3 % — AB (ref 36.0–46.0)
HEMOGLOBIN: 11.6 g/dL — AB (ref 12.0–15.0)
MCH: 30.2 pg (ref 26.0–34.0)
MCHC: 33.8 g/dL (ref 30.0–36.0)
MCV: 89.3 fL (ref 78.0–100.0)
Platelets: 219 10*3/uL (ref 150–400)
RBC: 3.84 MIL/uL — AB (ref 3.87–5.11)
RDW: 12.2 % (ref 11.5–15.5)
WBC: 6.8 10*3/uL (ref 4.0–10.5)

## 2016-09-20 LAB — URINALYSIS, ROUTINE W REFLEX MICROSCOPIC
BILIRUBIN URINE: NEGATIVE
GLUCOSE, UA: NEGATIVE mg/dL
HGB URINE DIPSTICK: NEGATIVE
Ketones, ur: 20 mg/dL — AB
LEUKOCYTES UA: NEGATIVE
NITRITE: NEGATIVE
PROTEIN: 100 mg/dL — AB
Specific Gravity, Urine: 1.019 (ref 1.005–1.030)
pH: 9 — ABNORMAL HIGH (ref 5.0–8.0)

## 2016-09-20 LAB — COMPREHENSIVE METABOLIC PANEL
ALK PHOS: 76 U/L (ref 38–126)
ALT: 26 U/L (ref 14–54)
ANION GAP: 11 (ref 5–15)
AST: 31 U/L (ref 15–41)
Albumin: 4.1 g/dL (ref 3.5–5.0)
BILIRUBIN TOTAL: 0.7 mg/dL (ref 0.3–1.2)
BUN: 8 mg/dL (ref 6–20)
CALCIUM: 9.4 mg/dL (ref 8.9–10.3)
CO2: 18 mmol/L — ABNORMAL LOW (ref 22–32)
Chloride: 109 mmol/L (ref 101–111)
Creatinine, Ser: 0.71 mg/dL (ref 0.44–1.00)
GLUCOSE: 175 mg/dL — AB (ref 65–99)
POTASSIUM: 2.9 mmol/L — AB (ref 3.5–5.1)
Sodium: 138 mmol/L (ref 135–145)
TOTAL PROTEIN: 7.5 g/dL (ref 6.5–8.1)

## 2016-09-20 LAB — LIPASE, BLOOD: Lipase: 35 U/L (ref 11–51)

## 2016-09-20 LAB — POCT PREGNANCY, URINE: Preg Test, Ur: NEGATIVE

## 2016-09-20 MED ORDER — ONDANSETRON 4 MG PO TBDP
4.0000 mg | ORAL_TABLET | Freq: Once | ORAL | Status: AC | PRN
  Administered 2016-09-20: 4 mg via ORAL
  Filled 2016-09-20: qty 1

## 2016-09-20 NOTE — ED Triage Notes (Signed)
Patient reports she is experencing right lower abd pain with vomiting. [facility] but left due to the long wait. Pt reports the pain has continued. No active vomiting in triage.

## 2016-09-20 NOTE — ED Notes (Signed)
Pt reports she had blood drawn and urine taken from previous visit.

## 2016-09-20 NOTE — MAU Note (Signed)
Pt presents to MAU with complaints of lower abdominal pain that started earlier today. PT denies any vaginal bleeding or abnormal discharge. PT  [facility] and waited and never got to see a physician.

## 2016-09-20 NOTE — ED Triage Notes (Signed)
Per pt-states she has a history of ovarian cysts-started  Having right lower abdominal pain and vomiting this am-states she had ovarian cyst on right removed a year ago

## 2016-09-21 ENCOUNTER — Emergency Department (HOSPITAL_COMMUNITY)
Admission: EM | Admit: 2016-09-21 | Discharge: 2016-09-21 | Disposition: A | Discharge status: Home / Self Care | Payer: Self-pay | Attending: Emergency Medicine | Admitting: Emergency Medicine

## 2016-09-21 DIAGNOSIS — R1110 Vomiting, unspecified: Secondary | ICD-10-CM

## 2016-09-21 MED ORDER — ONDANSETRON 4 MG PO TBDP
4.0000 mg | ORAL_TABLET | Freq: Once | ORAL | Status: AC
  Administered 2016-09-21: 4 mg via ORAL
  Filled 2016-09-21: qty 1

## 2016-09-21 MED ORDER — ONDANSETRON HCL 4 MG/2ML IJ SOLN: 4.0000 mg | Freq: Once | INTRAMUSCULAR | Status: DC

## 2016-09-21 MED ORDER — ONDANSETRON 4 MG PO TBDP: 4.0000 mg | ORAL_TABLET | Freq: Three times a day (TID) | ORAL | 0 refills | Status: DC | PRN

## 2016-09-21 MED ORDER — ONDANSETRON HCL 4 MG/2ML IJ SOLN
4.0000 mg | Freq: Once | INTRAMUSCULAR | Status: AC
  Administered 2016-09-21: 4 mg via INTRAVENOUS
  Filled 2016-09-21: qty 2

## 2016-09-21 MED ORDER — SODIUM CHLORIDE 0.9 % IV BOLUS (SEPSIS)
1000.0000 mL | Freq: Once | INTRAVENOUS | Status: AC
  Administered 2016-09-21: 1000 mL via INTRAVENOUS

## 2016-09-21 MED ORDER — SODIUM CHLORIDE 0.9 % IV BOLUS (SEPSIS): 1000.0000 mL | Freq: Once | INTRAVENOUS | Status: DC

## 2016-09-21 NOTE — Discharge Instructions (Signed)
You were seen today for vomiting and abdominal pain. Your workup is largely reassuring. You do have mildly low potassium. Make sure the potassium rich foods. Make sure to stay hydrated. You will be given a prescription for nausea medication. If you have any new or worsening symptoms she should be reevaluated.

## 2016-09-21 NOTE — ED Notes (Signed)
Pt given another ginger ale per EDP Horton

## 2016-09-21 NOTE — ED Provider Notes (Signed)
[facility]-EMERGENCY DEPT Provider Note   CSN: [id number] Arrival date & time: 09/20/16  2149  By signing my name below, I, Modena Jansky, attest that this documentation has been prepared under the direction and in the presence of Shon Baton, MD. Electronically Signed: Modena Jansky, Scribe. 09/21/2016. 1:37 AM.  History   Chief Complaint Chief Complaint  Patient presents with  . Abdominal Pain  . Emesis   The history is provided by the patient. No language interpreter was used.   HPI Comments: Whitney Carter is a 26 y.o. female who presents to the Emergency Department complaining of constant moderate lower abdominal pain that started about 4 hours ago. She states woke up with a sudden onset of pain with associated vomiting. She took [facility] powder with some relief. She describes the pain as a non radiating, sharp sensation. She currently rates the pain as a 10/10. She reports associated diaphoresis, subjective fever, and chills. Pt's temperature in the ED today was 97.8. Her LMNP was last month. She denies any diarrhea, dysuria, hematuria, or other complaints.   Past Medical History:  Diagnosis Date  . Anemia    CHILDHOOD  . Bilateral ovarian cysts   . Dislocation of right shoulder joint   . Scaphoid fracture, wrist, closed    right    Patient Active Problem List   Diagnosis Date Noted  . Endometriosis 01/10/2016  . Right ovarian cyst   . Cyst of right ovary   . Health care maintenance 11/26/2014  . Environmental and seasonal allergies 11/03/2014  . RLQ abdominal pain 09/18/2014  . Hypokalemia 09/18/2014  . Anemia 09/18/2014  . Dysmenorrhea 03/04/2014    Past Surgical History:  Procedure Laterality Date  . LAPAROSCOPIC OVARIAN CYSTECTOMY Right 05/18/2015   Procedure: LAPAROSCOPIC OVARIAN CYSTECTOMY;  Surgeon: Catalina Antigua, MD;  Location: [facility];  Service: Gynecology;  Laterality: Right;  Requested 05-18-15 @ 7:30a  . LAPAROSCOPIC OVARIAN CYSTECTOMY Right 01/10/2016   Procedure: LAPAROSCOPIC RIGHT OVARIAN CYSTECTOMY;  Surgeon: Catalina Antigua, MD;  Location: [facility];  Service: Gynecology;  Laterality: Right;    OB History    Gravida Para Term Preterm AB Living   1 0 0 0 0 0   SAB TAB Ectopic Multiple Live Births   0 0 0 0         Home Medications    Prior to Admission medications   Medication Sig Start Date End Date Taking? Authorizing Provider  docusate sodium (COLACE) 100 MG capsule Take 1 capsule (100 mg total) by mouth 2 (two) times daily as needed. Patient not taking: Reported on 09/21/2016 01/10/16   Catalina Antigua, MD  ibuprofen (ADVIL,MOTRIN) 600 MG tablet Take 1 tablet (600 mg total) by mouth every 6 (six) hours as needed. Patient not taking: Reported on 09/21/2016 01/10/16   Catalina Antigua, MD  norgestimate-ethinyl estradiol (ORTHO-CYCLEN,SPRINTEC,PREVIFEM) 0.25-35 MG-MCG tablet Take 1 tablet by mouth daily. Patient not taking: Reported on 09/21/2016 02/03/16   Catalina Antigua, MD  ondansetron (ZOFRAN ODT) 4 MG disintegrating tablet Take 1 tablet (4 mg total) by mouth every 8 (eight) hours as needed for nausea or vomiting. 09/21/16   Shon Baton, MD  oxyCODONE-acetaminophen (PERCOCET/ROXICET) 5-325 MG tablet Take 1 tablet by mouth every 4 (four) hours as needed. Patient not taking: Reported on 02/03/2016 01/10/16   Catalina Antigua, MD    Family History Family History  Problem Relation Age of Onset  . Hypertension Mother   . Hypertension Maternal Grandmother   . Diabetes Maternal Grandmother   .  Hyperlipidemia Maternal Grandmother     Social History Social History  Substance Use Topics  . Smoking status: Never Smoker  . Smokeless tobacco: Never Used  . Alcohol use Yes     Comment: socially     Allergies   Bee venom   Review of Systems Review of Systems  Constitutional: Positive for chills, diaphoresis and fever (Subjective).  Gastrointestinal: Positive for abdominal pain (Lower) and vomiting. Negative for diarrhea.    Genitourinary: Negative for dysuria and hematuria.  All other systems reviewed and are negative.    Physical Exam Updated Vital Signs BP 130/76 (BP Location: Right Arm)   Pulse (!) 51   Temp 97.8 F (36.6 C) (Oral)   Resp 18   Ht 5\' 7"  (1.702 m)   Wt 141 lb (64 kg)   LMP 09/20/2016   SpO2 100%   BMI 22.08 kg/m   Physical Exam  Constitutional: She is oriented to person, place, and time. She appears well-developed and well-nourished.  HENT:  Head: Normocephalic and atraumatic.  Cardiovascular: Normal rate, regular rhythm and normal heart sounds.   No murmur heard. Pulmonary/Chest: Effort normal and breath sounds normal. No respiratory distress. She has no wheezes.  Abdominal: Soft. Bowel sounds are normal. There is no tenderness. There is no guarding.  Neurological: She is alert and oriented to person, place, and time.  Skin: Skin is warm and dry.  Psychiatric: She has a normal mood and affect.  Nursing note and vitals reviewed.    ED Treatments / Results  DIAGNOSTIC STUDIES: Oxygen Saturation is 100% on RA, normal by my interpretation.    COORDINATION OF CARE: 1:41 AM- Pt advised of plan for treatment and pt agrees.  Labs (all labs ordered are listed, but only abnormal results are displayed) Labs Reviewed - No data to display  EKG  EKG Interpretation None       Radiology No results found.  Procedures Procedures (including critical care time)  Medications Ordered in ED Medications  ondansetron (ZOFRAN-ODT) disintegrating tablet 4 mg (4 mg Oral Given 09/21/16 0302)  sodium chloride 0.9 % bolus 1,000 mL (1,000 mLs Intravenous New Bag/Given 09/21/16 0421)  ondansetron (ZOFRAN) injection 4 mg (4 mg Intravenous Given 09/21/16 0424)     Initial Impression / Assessment and Plan / ED Course  I have reviewed the triage vital signs and the nursing notes.  Pertinent labs & imaging results that were available during my care of the patient were reviewed by me and  considered in my medical decision making (see chart for details).     Patient presents with abdominal pain and vomiting. She is nontoxic on exam. Vital signs reassuring. I have reviewed patient's lab work from prior ED visit when she was not assessed. It is largely reassuring. She does have mild hypokalemia. Her abdominal exam is benign. I have given patient the choice of IV fluids and Zofran with by mouth challenge versus Zofran ODT and a by mouth challenge. She initially tried Zofran ODT but continued to vomit. She was given 1 L of fluid and received IV Zofran. She is able to tolerate fluids. Suspect viral etiology. Discharge home with Zofran and supportive measures.  After history, exam, and medical workup I feel the patient has been appropriately medically screened and is safe for discharge home. Pertinent diagnoses were discussed with the patient. Patient was given return precautions.   Final Clinical Impressions(s) / ED Diagnoses   Final diagnoses:  Non-intractable vomiting, presence of nausea not specified, unspecified vomiting  type    New Prescriptions New Prescriptions   ONDANSETRON (ZOFRAN ODT) 4 MG DISINTEGRATING TABLET    Take 1 tablet (4 mg total) by mouth every 8 (eight) hours as needed for nausea or vomiting.   I personally performed the services described in this documentation, which was scribed in my presence. The recorded information has been reviewed and is accurate.     Shon Batonourtney F Alaria Oconnor, MD 09/21/16 [phone]

## 2016-09-22 ENCOUNTER — Emergency Department (HOSPITAL_COMMUNITY): Payer: Self-pay

## 2016-09-22 ENCOUNTER — Encounter (HOSPITAL_COMMUNITY): Payer: Self-pay | Admitting: *Deleted

## 2016-09-22 ENCOUNTER — Observation Stay (HOSPITAL_COMMUNITY)
Admission: EM | Admit: 2016-09-22 | Discharge: 2016-09-23 | Disposition: A | Discharge status: Home / Self Care | Payer: Self-pay | Attending: Family Medicine | Admitting: Family Medicine

## 2016-09-22 ENCOUNTER — Observation Stay (HOSPITAL_COMMUNITY): Payer: Self-pay

## 2016-09-22 ENCOUNTER — Ambulatory Visit (HOSPITAL_COMMUNITY)
Admission: EM | Admit: 2016-09-22 | Discharge: 2016-09-22 | Disposition: A | Discharge status: Other Acute Inpatient Hospital | Payer: Self-pay | Attending: Family Medicine | Admitting: Family Medicine

## 2016-09-22 DIAGNOSIS — K529 Noninfective gastroenteritis and colitis, unspecified: Secondary | ICD-10-CM

## 2016-09-22 DIAGNOSIS — N83209 Unspecified ovarian cyst, unspecified side: Secondary | ICD-10-CM | POA: Insufficient documentation

## 2016-09-22 DIAGNOSIS — N946 Dysmenorrhea, unspecified: Secondary | ICD-10-CM | POA: Insufficient documentation

## 2016-09-22 DIAGNOSIS — Z8249 Family history of ischemic heart disease and other diseases of the circulatory system: Secondary | ICD-10-CM | POA: Insufficient documentation

## 2016-09-22 DIAGNOSIS — Z91030 Bee allergy status: Secondary | ICD-10-CM | POA: Insufficient documentation

## 2016-09-22 DIAGNOSIS — R112 Nausea with vomiting, unspecified: Secondary | ICD-10-CM | POA: Diagnosis present

## 2016-09-22 DIAGNOSIS — Z79899 Other long term (current) drug therapy: Secondary | ICD-10-CM | POA: Insufficient documentation

## 2016-09-22 DIAGNOSIS — D649 Anemia, unspecified: Secondary | ICD-10-CM | POA: Insufficient documentation

## 2016-09-22 DIAGNOSIS — R1110 Vomiting, unspecified: Principal | ICD-10-CM | POA: Insufficient documentation

## 2016-09-22 DIAGNOSIS — E876 Hypokalemia: Secondary | ICD-10-CM | POA: Insufficient documentation

## 2016-09-22 DIAGNOSIS — R638 Other symptoms and signs concerning food and fluid intake: Secondary | ICD-10-CM

## 2016-09-22 DIAGNOSIS — R109 Unspecified abdominal pain: Secondary | ICD-10-CM

## 2016-09-22 DIAGNOSIS — Z833 Family history of diabetes mellitus: Secondary | ICD-10-CM | POA: Insufficient documentation

## 2016-09-22 DIAGNOSIS — N809 Endometriosis, unspecified: Secondary | ICD-10-CM | POA: Insufficient documentation

## 2016-09-22 DIAGNOSIS — K859 Acute pancreatitis without necrosis or infection, unspecified: Secondary | ICD-10-CM

## 2016-09-22 DIAGNOSIS — F1290 Cannabis use, unspecified, uncomplicated: Secondary | ICD-10-CM

## 2016-09-22 DIAGNOSIS — R739 Hyperglycemia, unspecified: Secondary | ICD-10-CM | POA: Insufficient documentation

## 2016-09-22 LAB — COMPREHENSIVE METABOLIC PANEL
ALT: 32 U/L (ref 14–54)
AST: 31 U/L (ref 15–41)
Albumin: 3.5 g/dL (ref 3.5–5.0)
Alkaline Phosphatase: 59 U/L (ref 38–126)
Anion gap: 10 (ref 5–15)
BILIRUBIN TOTAL: 0.8 mg/dL (ref 0.3–1.2)
BUN: 7 mg/dL (ref 6–20)
CO2: 21 mmol/L — ABNORMAL LOW (ref 22–32)
CREATININE: 0.79 mg/dL (ref 0.44–1.00)
Calcium: 8.3 mg/dL — ABNORMAL LOW (ref 8.9–10.3)
Chloride: 107 mmol/L (ref 101–111)
Glucose, Bld: 116 mg/dL — ABNORMAL HIGH (ref 65–99)
Potassium: 2.9 mmol/L — ABNORMAL LOW (ref 3.5–5.1)
Sodium: 138 mmol/L (ref 135–145)
Total Protein: 6.3 g/dL — ABNORMAL LOW (ref 6.5–8.1)

## 2016-09-22 LAB — URINALYSIS, ROUTINE W REFLEX MICROSCOPIC
Bacteria, UA: NONE SEEN
Bilirubin Urine: NEGATIVE
GLUCOSE, UA: NEGATIVE mg/dL
KETONES UR: 5 mg/dL — AB
Leukocytes, UA: NEGATIVE
NITRITE: NEGATIVE
PH: 6 (ref 5.0–8.0)
PROTEIN: NEGATIVE mg/dL
Specific Gravity, Urine: 1.017 (ref 1.005–1.030)

## 2016-09-22 LAB — CBC WITH DIFFERENTIAL/PLATELET
BASOS ABS: 0 10*3/uL (ref 0.0–0.1)
BASOS PCT: 0 %
EOS PCT: 0 %
Eosinophils Absolute: 0 10*3/uL (ref 0.0–0.7)
HCT: 32.4 % — ABNORMAL LOW (ref 36.0–46.0)
Hemoglobin: 10.8 g/dL — ABNORMAL LOW (ref 12.0–15.0)
Lymphocytes Relative: 16 %
Lymphs Abs: 1 10*3/uL (ref 0.7–4.0)
MCH: 30.7 pg (ref 26.0–34.0)
MCHC: 33.3 g/dL (ref 30.0–36.0)
MCV: 92 fL (ref 78.0–100.0)
MONO ABS: 0.3 10*3/uL (ref 0.1–1.0)
Monocytes Relative: 5 %
Neutro Abs: 5 10*3/uL (ref 1.7–7.7)
Neutrophils Relative %: 79 %
PLATELETS: 184 10*3/uL (ref 150–400)
RBC: 3.52 MIL/uL — AB (ref 3.87–5.11)
RDW: 12.7 % (ref 11.5–15.5)
WBC: 6.4 10*3/uL (ref 4.0–10.5)

## 2016-09-22 LAB — LIPASE, BLOOD: LIPASE: 82 U/L — AB (ref 11–51)

## 2016-09-22 LAB — POC URINE PREG, ED: Preg Test, Ur: NEGATIVE

## 2016-09-22 MED ORDER — ONDANSETRON 4 MG PO TBDP: 4.0000 mg | ORAL_TABLET | Freq: Three times a day (TID) | ORAL | 0 refills | Status: DC | PRN

## 2016-09-22 MED ORDER — PROMETHAZINE HCL 25 MG/ML IJ SOLN: 12.5000 mg | Freq: Four times a day (QID) | INTRAMUSCULAR | Status: DC | PRN

## 2016-09-22 MED ORDER — PROMETHAZINE HCL 25 MG PO TABS: 12.5000 mg | ORAL_TABLET | Freq: Four times a day (QID) | ORAL | Status: DC | PRN

## 2016-09-22 MED ORDER — POTASSIUM CHLORIDE CRYS ER 20 MEQ PO TBCR
40.0000 meq | EXTENDED_RELEASE_TABLET | Freq: Once | ORAL | Status: AC
Start: 2016-09-23 — End: 2016-09-23
  Administered 2016-09-23: 40 meq via ORAL
  Filled 2016-09-22: qty 2

## 2016-09-22 MED ORDER — ACETAMINOPHEN 650 MG RE SUPP: 650.0000 mg | Freq: Four times a day (QID) | RECTAL | Status: DC | PRN

## 2016-09-22 MED ORDER — ACETAMINOPHEN 325 MG PO TABS: 650.0000 mg | ORAL_TABLET | Freq: Four times a day (QID) | ORAL | Status: DC | PRN

## 2016-09-22 MED ORDER — ONDANSETRON HCL 4 MG/2ML IJ SOLN
INTRAMUSCULAR | Status: AC
  Filled 2016-09-22: qty 2

## 2016-09-22 MED ORDER — OXYCODONE HCL 5 MG PO TABS: 5.0000 mg | ORAL_TABLET | Freq: Four times a day (QID) | ORAL | 0 refills | Status: DC | PRN

## 2016-09-22 MED ORDER — FAMOTIDINE 20 MG PO TABS
20.0000 mg | ORAL_TABLET | Freq: Two times a day (BID) | ORAL | Status: DC
  Administered 2016-09-22 – 2016-09-23 (×2): 20 mg via ORAL
  Filled 2016-09-22 (×2): qty 1

## 2016-09-22 MED ORDER — DICYCLOMINE HCL 20 MG PO TABS
20.0000 mg | ORAL_TABLET | Freq: Three times a day (TID) | ORAL | Status: DC
  Administered 2016-09-22 – 2016-09-23 (×3): 20 mg via ORAL
  Filled 2016-09-22 (×3): qty 1

## 2016-09-22 MED ORDER — ONDANSETRON HCL 4 MG/2ML IJ SOLN
4.0000 mg | Freq: Once | INTRAMUSCULAR | Status: AC
  Administered 2016-09-22: 4 mg via INTRAVENOUS

## 2016-09-22 MED ORDER — ENOXAPARIN SODIUM 40 MG/0.4ML ~~LOC~~ SOLN
40.0000 mg | SUBCUTANEOUS | Status: DC
  Administered 2016-09-22: 40 mg via SUBCUTANEOUS
  Filled 2016-09-22: qty 0.4

## 2016-09-22 MED ORDER — SODIUM CHLORIDE 0.9 % IV BOLUS (SEPSIS)
1000.0000 mL | Freq: Once | INTRAVENOUS | Status: AC
  Administered 2016-09-22: 1000 mL via INTRAVENOUS

## 2016-09-22 MED ORDER — KETOROLAC TROMETHAMINE 15 MG/ML IJ SOLN
15.0000 mg | Freq: Three times a day (TID) | INTRAMUSCULAR | Status: AC | PRN
  Administered 2016-09-23 (×2): 15 mg via INTRAVENOUS
  Filled 2016-09-22 (×2): qty 1

## 2016-09-22 MED ORDER — ONDANSETRON HCL 4 MG/2ML IJ SOLN: 4.0000 mg | Freq: Once | INTRAMUSCULAR | Status: DC

## 2016-09-22 MED ORDER — POLYETHYLENE GLYCOL 3350 17 G PO PACK: 17.0000 g | PACK | Freq: Every day | ORAL | Status: DC | PRN

## 2016-09-22 MED ORDER — POTASSIUM CHLORIDE CRYS ER 20 MEQ PO TBCR
40.0000 meq | EXTENDED_RELEASE_TABLET | Freq: Once | ORAL | Status: AC
  Administered 2016-09-22: 40 meq via ORAL
  Filled 2016-09-22: qty 2

## 2016-09-22 MED ORDER — SODIUM CHLORIDE 0.9 % IV SOLN
INTRAVENOUS | Status: DC
  Administered 2016-09-22 – 2016-09-23 (×2): via INTRAVENOUS

## 2016-09-22 MED ORDER — GI COCKTAIL ~~LOC~~
30.0000 mL | Freq: Once | ORAL | Status: AC
  Administered 2016-09-22: 30 mL via ORAL
  Filled 2016-09-22: qty 30

## 2016-09-22 MED ORDER — FENTANYL CITRATE (PF) 100 MCG/2ML IJ SOLN
50.0000 ug | INTRAMUSCULAR | Status: AC | PRN
  Administered 2016-09-22 (×2): 50 ug via INTRAVENOUS
  Filled 2016-09-22 (×2): qty 2

## 2016-09-22 MED ORDER — POTASSIUM CHLORIDE ER 10 MEQ PO TBCR: 10.0000 meq | EXTENDED_RELEASE_TABLET | Freq: Two times a day (BID) | ORAL | 0 refills | Status: DC

## 2016-09-22 MED ORDER — ONDANSETRON HCL 4 MG/2ML IJ SOLN
4.0000 mg | Freq: Once | INTRAMUSCULAR | Status: AC
  Administered 2016-09-22: 4 mg via INTRAVENOUS
  Filled 2016-09-22: qty 2

## 2016-09-22 NOTE — Discharge Instructions (Addendum)
Ms. Steele BergHinson,  Please continue bland diet for the next several days. Take zofran as needed for nausea. I suspect you have a stomach virus. Take ibuprofen/tylenol as needed. Pepcid may help with reflux symptoms. Bentyl can help with gas pains.   Bland Diet Introduction A bland diet consists of foods that do not have a lot of fat or fiber. Foods without fat or fiber are easier for the body to digest. They are also less likely to irritate your mouth, throat, stomach, and other parts of your gastrointestinal tract. A bland diet is sometimes called a BRAT diet. What is my plan? Your health care provider or dietitian may recommend specific changes to your diet to prevent and treat your symptoms, such as:  Eating small meals often.  Cooking food until it is soft enough to chew easily.  Chewing your food well.  Drinking fluids slowly.  Not eating foods that are very spicy, sour, or fatty.  Not eating citrus fruits, such as oranges and grapefruit. What do I need to know about this diet?  Eat a variety of foods from the bland diet food list.  Do not follow a bland diet longer than you have to.  Ask your health care provider whether you should take vitamins. What foods can I eat? Grains  Hot cereals, such as cream of wheat. Bread, crackers, or tortillas made from refined white flour. Rice. Vegetables  Canned or cooked vegetables. Mashed or boiled potatoes. Fruits  Bananas. Applesauce. Other types of cooked or canned fruit with the skin and seeds removed, such as canned peaches or pears. Meats and Other Protein Sources  Scrambled eggs. Creamy peanut butter or other nut butters. Lean, well-cooked meats, such as chicken or fish. Tofu. Soups or broths. Dairy  Low-fat dairy products, such as milk, cottage cheese, or yogurt. Beverages  Water. Herbal tea. Apple juice. Sweets and Desserts  Pudding. Custard. Fruit gelatin. Ice cream. Fats and Oils  Mild salad dressings. Canola or olive  oil. The items listed above may not be a complete list of allowed foods or beverages. Contact your dietitian for more options.  What foods are not recommended? Foods and ingredients that are often not recommended include:  Spicy foods, such as hot sauce or salsa.  Fried foods.  Sour foods, such as pickled or fermented foods.  Raw vegetables or fruits, especially citrus or berries.  Caffeinated drinks.  Alcohol.  Strongly flavored seasonings or condiments. The items listed above may not be a complete list of foods and beverages that are not allowed. Contact your dietitian for more information.  This information is not intended to replace advice given to you by your health care provider. Make sure you discuss any questions you have with your health care provider. Document Released: 11/15/2015 Document Revised: 12/30/2015 Document Reviewed: 08/05/2014  2017 [employer]

## 2016-09-22 NOTE — ED Provider Notes (Signed)
[facility] Provider Note   CSN: [id number] Arrival date & time: 09/22/16  1242     History   Chief Complaint Chief Complaint  Patient presents with  . Abdominal Pain  . Emesis    HPI Whitney Carter is a 26 y.o. female.  Pt is a 26 y/o F with PMHx of anemia and ovarian cysts who presents to ED for epigastric abdominal pain, onset 3 days ago, describes as sharp, with associated nausea and nonbloody emesis. Was evaluated at [facility] yesterday and Urgent Care today for similar symptoms. Denies recent travel or known sick contacts. States she ate Mindi Slicker prior to symptom onset. Denies fevers, chills, unexplained weight loss, dizziness, vision or gait changes, Cp, SOB, cough, hematemesis, diarrhea, dysuria, vaginal discharge, or any additional concerns. Currently on menstrual cycle.   The history is provided by the patient. No language interpreter was used.    Past Medical History:  Diagnosis Date  . Anemia    CHILDHOOD  . Bilateral ovarian cysts   . Dislocation of right shoulder joint   . Scaphoid fracture, wrist, closed    right    Patient Active Problem List   Diagnosis Date Noted  . Endometriosis 01/10/2016  . Right ovarian cyst   . Cyst of right ovary   . Health care maintenance 11/26/2014  . Environmental and seasonal allergies 11/03/2014  . RLQ abdominal pain 09/18/2014  . Hypokalemia 09/18/2014  . Anemia 09/18/2014  . Dysmenorrhea 03/04/2014    Past Surgical History:  Procedure Laterality Date  . LAPAROSCOPIC OVARIAN CYSTECTOMY Right 05/18/2015   Procedure: LAPAROSCOPIC OVARIAN CYSTECTOMY;  Surgeon: Catalina Antigua, MD;  Location: [facility];  Service: Gynecology;  Laterality: Right;  Requested 05-18-15 @ 7:30a  . LAPAROSCOPIC OVARIAN CYSTECTOMY Right 01/10/2016   Procedure: LAPAROSCOPIC RIGHT OVARIAN CYSTECTOMY;  Surgeon: Catalina Antigua, MD;  Location: [facility];  Service: Gynecology;  Laterality: Right;    OB History    Gravida Para Term Preterm AB  Living   1 0 0 0 0 0   SAB TAB Ectopic Multiple Live Births   0 0 0 0         Home Medications    Prior to Admission medications   Medication Sig Start Date End Date Taking? Authorizing Provider  naproxen sodium (ANAPROX) 220 MG tablet Take 220 mg by mouth 2 (two) times daily as needed (pain).   Yes Historical Provider, MD  docusate sodium (COLACE) 100 MG capsule Take 1 capsule (100 mg total) by mouth 2 (two) times daily as needed. Patient not taking: Reported on 09/21/2016 01/10/16   Catalina Antigua, MD  ibuprofen (ADVIL,MOTRIN) 600 MG tablet Take 1 tablet (600 mg total) by mouth every 6 (six) hours as needed. Patient not taking: Reported on 09/21/2016 01/10/16   Catalina Antigua, MD  norgestimate-ethinyl estradiol (ORTHO-CYCLEN,SPRINTEC,PREVIFEM) 0.25-35 MG-MCG tablet Take 1 tablet by mouth daily. Patient not taking: Reported on 09/21/2016 02/03/16   Catalina Antigua, MD  ondansetron (ZOFRAN ODT) 4 MG disintegrating tablet Take 1 tablet (4 mg total) by mouth every 8 (eight) hours as needed for nausea or vomiting. 09/22/16   Hinton Dyer Raiden Yearwood, NP  oxyCODONE (ROXICODONE) 5 MG immediate release tablet Take 1 tablet (5 mg total) by mouth every 6 (six) hours as needed for severe pain. 09/22/16   Hinton Dyer Almeter Westhoff, NP  oxyCODONE-acetaminophen (PERCOCET/ROXICET) 5-325 MG tablet Take 1 tablet by mouth every 4 (four) hours as needed. Patient not taking: Reported on 02/03/2016 01/10/16   Catalina Antigua, MD  potassium chloride (K-DUR) 10 MEQ tablet Take 1 tablet (10 mEq total) by mouth 2 (two) times daily. Take for 4 days. 09/22/16 09/26/16  Albesa Seen, NP    Family History Family History  Problem Relation Age of Onset  . Hypertension Mother   . Hypertension Maternal Grandmother   . Diabetes Maternal Grandmother   . Hyperlipidemia Maternal Grandmother     Social History Social History  Substance Use Topics  . Smoking status: Never Smoker  . Smokeless tobacco: Never Used  . Alcohol use Yes     Comment:  socially     Allergies   Bee venom   Review of Systems Review of Systems  Constitutional: Positive for appetite change. Negative for chills, fever and unexpected weight change.  Respiratory: Negative for cough and shortness of breath.   Cardiovascular: Negative for chest pain and leg swelling.  Gastrointestinal: Positive for abdominal pain, nausea and vomiting. Negative for diarrhea.  Genitourinary: Negative for dysuria, flank pain and vaginal discharge.  Musculoskeletal: Negative for back pain, neck pain and neck stiffness.  Skin: Negative for rash.  Neurological: Negative for dizziness, weakness, numbness and headaches.     Physical Exam Updated Vital Signs BP 133/91   Pulse (!) 48   Temp 97.9 F (36.6 C) (Oral)   Resp 16   Ht 5\' 7"  (1.702 m)   Wt 64 kg   LMP 09/20/2016   SpO2 96%   BMI 22.08 kg/m   Physical Exam  Constitutional: She is oriented to person, place, and time. She appears well-developed and well-nourished.  HENT:  Head: Normocephalic.  Mouth/Throat: Uvula is midline, oropharynx is clear and moist and mucous membranes are normal.  Eyes: Conjunctivae and EOM are normal. Pupils are equal, round, and reactive to light.  Neck: Normal range of motion. Neck supple.  Cardiovascular: Normal rate, regular rhythm, normal heart sounds and intact distal pulses.  Exam reveals no gallop and no friction rub.   No murmur heard. Pulmonary/Chest: Effort normal and breath sounds normal. She has no wheezes. She has no rales.  Abdominal: Soft. Bowel sounds are normal. There is tenderness (epigastric ). There is no rebound and no guarding. No hernia.  Musculoskeletal: Normal range of motion.  Neurological: She is alert and oriented to person, place, and time.  Skin: Skin is warm and dry.  Psychiatric: She has a normal mood and affect.  Nursing note and vitals reviewed.    ED Treatments / Results  Labs (all labs ordered are listed, but only abnormal results are  displayed) Labs Reviewed  URINALYSIS, ROUTINE W REFLEX MICROSCOPIC - Abnormal; Notable for the following:       Result Value   Hgb urine dipstick SMALL (*)    Ketones, ur 5 (*)    Squamous Epithelial / LPF 0-5 (*)    All other components within normal limits  COMPREHENSIVE METABOLIC PANEL - Abnormal; Notable for the following:    Potassium 2.9 (*)    CO2 21 (*)    Glucose, Bld 116 (*)    Calcium 8.3 (*)    Total Protein 6.3 (*)    All other components within normal limits  LIPASE, BLOOD - Abnormal; Notable for the following:    Lipase 82 (*)    All other components within normal limits  CBC WITH DIFFERENTIAL/PLATELET - Abnormal; Notable for the following:    RBC 3.52 (*)    Hemoglobin 10.8 (*)    HCT 32.4 (*)    All other components within  normal limits  POC URINE PREG, ED    EKG  EKG Interpretation None       Radiology No results found.  Procedures Procedures (including critical care time)  Medications Ordered in ED Medications  fentaNYL (SUBLIMAZE) injection 50 mcg (50 mcg Intravenous Given 09/22/16 1311)  potassium chloride SA (K-DUR,KLOR-CON) CR tablet 40 mEq (not administered)  ondansetron (ZOFRAN) injection 4 mg (4 mg Intravenous Given 09/22/16 1311)  sodium chloride 0.9 % bolus 1,000 mL (1,000 mLs Intravenous New Bag/Given 09/22/16 1355)     Initial Impression / Assessment and Plan / ED Course  I have reviewed the triage vital signs and the nursing notes.  Pertinent labs & imaging results that were available during my care of the patient were reviewed by me and considered in my medical decision making (see chart for details).    Pt is a 26 y/o F who presents to ED for epigastric abdominal pain, concern for gastritis vs pancreatitis. Nontoxic appearing. Will get CBC, CMP, and lipase. Plan to give IVF, antiemetic, analgesia and re-eval.  4:09 PM Lipase 82, K 2.9; pt updated on results. Plan to PO challenge, if tolerates, plan to discharge with analgesia,  antiemetic, K, and PCP f/u; pt amenable to this plan at this time  4:09 PM Pt unable to tolerate PO challenge; additional antiemetic ordered, plan to admit  4:58 PM Discussed with internal medicine, will eval and admit to their service; pt updated on plan  Final Clinical Impressions(s) / ED Diagnoses   Final diagnoses:  Abdominal pain, unspecified abdominal location  Nausea and vomiting, intractability of vomiting not specified, unspecified vomiting type  Acute pancreatitis, unspecified complication status, unspecified pancreatitis type    New Prescriptions New Prescriptions   ONDANSETRON (ZOFRAN ODT) 4 MG DISINTEGRATING TABLET    Take 1 tablet (4 mg total) by mouth every 8 (eight) hours as needed for nausea or vomiting.   OXYCODONE (ROXICODONE) 5 MG IMMEDIATE RELEASE TABLET    Take 1 tablet (5 mg total) by mouth every 6 (six) hours as needed for severe pain.   POTASSIUM CHLORIDE (K-DUR) 10 MEQ TABLET    Take 1 tablet (10 mEq total) by mouth 2 (two) times daily. Take for 4 days.     Albesa Seenobyn K Carlyann Placide, NP 09/22/16 1611    Albesa Seenobyn K Lancer Thurner, NP 09/22/16 1636    Albesa Seenobyn K Kimerly Rowand, NP 09/22/16 1659    Vanetta MuldersScott Zackowski, MD 09/23/16 [phone]

## 2016-09-22 NOTE — H&P (Signed)
Family Medicine Teaching [facility] Admission History and Physical Service Pager: [phone]  Patient name: Whitney Carter Medical record number: [id number] Date of birth: [birth year] Age: 26 y.o. Gender: female  Primary Care Provider: No PCP Per Patient (Has seen Dr. Kennon Rounds) Consultants: None Code Status: Full (confirmed on admission)  Chief Complaint: Intractable vomiting  Assessment and Plan: Whitney Carter is a 26 y.o. female presenting with intractable nausea and vomiting for past 3 days. PMH is significant for marijuana use and ovarian cysts that have been removed laparoscopically x 2 (2016, 2017).   Nausea and Vomiting: Differential includes viral gastroenteritis, cyclic vomiting syndrome, early pancreatitis, biliary colic, and constipation. Benign abdominal exam. No leukocytosis. Subjective fever/chills. Normal bowel gas pattern on KUB.  - Admit to observation, attending Dr. Randolm Idol - MIVFs overnight - Pepcid, bentyl ordered - Phenergan PO and IV 12.5 mg q6h prn - Tylenol 650 mg q6h prn for mild/moderate pain; toradol 15 mg IV q8h prn for 2 doses for moderate/severe pain - Counseled patient on bland diet as recovers - Repeat lipase, CBC, BMP in a.m. - RUQ U/S ordered to r/o biliary origin - lipid panel in a.m. to r/o hypertriglyceridemia  - Liquid diet, ADAT  Hypokalemia: K 2.9 on admission. Suspect 2/2 GI losses.  - Will order kdur 40 mEq x 2  Hyperglycemia: 175 on 2/14, 116 on admission. May be elevated in setting of stress of likely viral illness. Family history of diabetes. No anion gap.  - Will check hemoglobin A1c   Anemia: 10.8 on admission. BL ~ 11. Normal MVC. History of iron deficiency, per patient. Not on supplementation - Consider work-up outpatient - Would obtain gastric occult blood testing if has further dark vomitus  Marijuana Use: Patient no longer intends to use.  - Counsel on cyclic vomiting syndrome  FEN/GI: Liquid diet ADAT, NS @ 100 ml/hr,  pepcid PO Prophylaxis: Lovenox  Disposition: Discharge once tolerating PO  History of Present Illness:  Whitney Carter is a 26 y.o. female presenting with nausea and vomiting x 3 days. She was able to tolerate some fluids (sprite) and some fast food over the last 2 days but since this a.m. has not been able to tolerate even liquids. She was assessed at the [facility] on 2/14, at urgent care and the ED yesterday, and again today. She had been given zofran and tolerated PO challenge on these other occasions. Vomitus has looked like whatever food or drink she had just eaten but did have one episode of darker vomitus earlier today. She had not had a bowel movement for the last few days but had a loose BM this evening in the ED. 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 She has been having subjective fevers and chills but no muscle aches or cough. Some rhinorrhea and chest discomfort but only when vomiting. Quit smoking marijuana last week because she intends to continue her schooling, which will require drug screens. Fatty/salty foods seem to make abdominal pain worse. It is mostly in her upper abdomen and is sharp and crampy. She is currently on her menstrual cycle but pains are different from her normal menstrual cramps. She has taken perhaps 1-2 advil, BC powder since note feeling well but does not take on a regular basis.   In the ED, IV zofran was given x 2, followed by GI cocktail, as well as 1 L fluid bolus. Patient still could  not tolerate PO challenge. She was given IV fentanyl for pain.She has not been tachycardic or febrile. WBC 6.4. SCr 0.79. Upreg negative. UA with small ketones but normal specific gravity at 1.017; small hgb (on menstrual cycle), 5 mg/dL ketones, 0-5 squams. Lipase 82, increased from 35 two days ago. K 2.9, AST and ALT wnl at 31 and 32 respectively. KUB with normal bowel gas pattern.   Review Of Systems:  Per HPI with the following additions:   Review of Systems  Constitutional: Positive for chills, diaphoresis and malaise/fatigue. Negative for fever.  HENT: Positive for congestion. Negative for ear pain, nosebleeds and sore throat.   Eyes: Negative for double vision and pain.  Respiratory: Negative for cough and shortness of breath.   Cardiovascular: Negative for chest pain and palpitations.  Gastrointestinal: Positive for abdominal pain, nausea and vomiting.  Genitourinary: Negative for dysuria, flank pain and urgency.  Musculoskeletal: Negative for back pain, falls and myalgias.  Skin: Negative for itching and rash.  Neurological: Negative for speech change, focal weakness and weakness.  Psychiatric/Behavioral: Positive for substance abuse. Negative for hallucinations and suicidal ideas.    Patient Active Problem List   Diagnosis Date Noted  . Endometriosis 01/10/2016  . Right ovarian cyst   . Cyst of right ovary   . Health care maintenance 11/26/2014  . Environmental and seasonal allergies 11/03/2014  . RLQ abdominal pain 09/18/2014  . Hypokalemia 09/18/2014  . Anemia 09/18/2014  . Dysmenorrhea 03/04/2014    Past Medical History: Past Medical History:  Diagnosis Date  . Anemia    CHILDHOOD  . Bilateral ovarian cysts   . Dislocation of right shoulder joint   . Scaphoid fracture, wrist, closed    right    Past Surgical History: Past Surgical History:  Procedure Laterality Date  . LAPAROSCOPIC OVARIAN CYSTECTOMY Right 05/18/2015   Procedure: LAPAROSCOPIC OVARIAN CYSTECTOMY;  Surgeon: Catalina Antigua, MD;  Location: [facility];  Service: Gynecology;  Laterality: Right;  Requested 05-18-15 @ 7:30a  . LAPAROSCOPIC OVARIAN CYSTECTOMY Right 01/10/2016   Procedure: LAPAROSCOPIC RIGHT OVARIAN CYSTECTOMY;  Surgeon: Catalina Antigua, MD;  Location: [facility];  Service: Gynecology;  Laterality: Right;    Social History: Social History  Substance Use Topics  . Smoking status: Never  Smoker  . Smokeless tobacco: Never Used  . Alcohol use Yes     Comment: socially   Additional social history: Lives with girlfriend. Every other day marijuana use but quit last week.  Please also refer to relevant sections of EMR.  Family History: Family History  Problem Relation Age of Onset  . Hypertension Mother   . Hypertension Maternal Grandmother   . Diabetes Maternal Grandmother   . Hyperlipidemia Maternal Grandmother     Allergies and Medications: Allergies  Allergen Reactions  . Bee Venom Swelling   No current facility-administered medications on file prior to encounter.    Current Outpatient Prescriptions on File Prior to Encounter  Medication Sig Dispense Refill  . docusate sodium (COLACE) 100 MG capsule Take 1 capsule (100 mg total) by mouth 2 (two) times daily as needed. (Patient not taking: Reported on 09/21/2016) 30 capsule 2  . ibuprofen (ADVIL,MOTRIN) 600 MG tablet Take 1 tablet (600 mg total) by mouth every 6 (six) hours as needed. (Patient not taking: Reported on 09/21/2016) 30 tablet 2  . norgestimate-ethinyl estradiol (ORTHO-CYCLEN,SPRINTEC,PREVIFEM) 0.25-35 MG-MCG tablet Take 1 tablet by mouth daily. (Patient not taking: Reported on 09/21/2016) 1 Package 11  . oxyCODONE-acetaminophen (PERCOCET/ROXICET) 5-325 MG tablet Take 1  tablet by mouth every 4 (four) hours as needed. (Patient not taking: Reported on 02/03/2016) 20 tablet 0    Objective: BP 110/69 (BP Location: Right Arm)   Pulse 76   Temp 97.9 F (36.6 C) (Oral)   Resp 16   Ht 5\' 7"  (1.702 m)   Wt 141 lb (64 kg)   LMP 09/20/2016   SpO2 99%   BMI 22.08 kg/m  Exam: General: Well-appearing female, sitting hunched over but does not appear to be in acute distress Eyes: PERRLA, no conjunctivitis ENTM: MMM, slight erythema of posterior orpharynx Neck: Supple, FROM Cardiovascular: RRR, S1, S2, no m/r/g Respiratory: CTAB, speaking in complete sentences comfortably Gastrointestinal: Mild epigastric and  LUQ TTP. Soft, no rebound or guarding. ++BS. MSK: No CVA tenderness. Normal bulk and tone. Moving all extremities. Derm: No rashes on exposed skin. No LE edema. Neuro: AOx3, no focal deficits Psych: Normal mood and affect  Labs and Imaging: CBC BMET   Recent Labs Lab 09/22/16 1437  WBC 6.4  HGB 10.8*  HCT 32.4*  PLT 184    Recent Labs Lab 09/22/16 1437  NA 138  K 2.9*  CL 107  CO2 21*  BUN 7  CREATININE 0.79  GLUCOSE 116*  CALCIUM 8.3*     Dg Abd 1 View  Result Date: 09/22/2016 CLINICAL DATA:  Nausea and vomiting since Wednesday 09/20/2016, epigastric abdominal pain EXAM: ABDOMEN - 1 VIEW COMPARISON:  08/23/2014 FINDINGS: Normal bowel gas pattern. No bowel dilatation or bowel wall thickening. Osseous structures unremarkable. Single LEFT pelvic phleboliths stable. No urinary tract calcification. IMPRESSION: Normal exam. Electronically Signed   By: Ulyses SouthwardMark  Boles M.D.   On: 09/22/2016 18:31    Mariano Doshi Percell BostonMoen Penn Grissett, MD 09/22/2016, 6:10 PM PGY-2, [facility] FPTS Intern pager: [phone], text pages welcome

## 2016-09-22 NOTE — ED Triage Notes (Signed)
Patient comes in per [facility] from UC with n/v/abd pain. [facility] for same complaints and sent home. Patient then went to UC and was sent here. Last episode of emesis was with [facility]. [facility] v/s 127/89, 59 HR, 16 RR, 99%RA.

## 2016-09-22 NOTE — ED Notes (Signed)
Pt aware that urine sample is needed, but is unable to provide one at this time 

## 2016-09-22 NOTE — ED Notes (Signed)
Report  Phoned  To  Chad  rn  Charge  nurse 

## 2016-09-22 NOTE — Discharge Instructions (Signed)
Go directly to ER  

## 2016-09-22 NOTE — ED Notes (Signed)
Iv  Ns   1  Liter  Iv  Bolus      Via   20  Angio  l  Arm  1  Att   Site  Patent  Per  Dr  Artis Flockkindl

## 2016-09-22 NOTE — ED Notes (Signed)
Pt reports that she tolerated the ice chips well.

## 2016-09-22 NOTE — ED Provider Notes (Signed)
[facility]    CSN: [id number] Arrival date & time: 09/22/16  1106     History   Chief Complaint No chief complaint on file.   HPI Whitney Carter is a 26 y.o. female.    Emesis  Severity:  Moderate Duration:  3 days Timing:  Constant Progression:  Unchanged Chronicity:  New Recent urination:  Normal Context comment:  Seen in ER for same with no change in sx. Relieved by:  Nothing Associated symptoms: abdominal pain, chills, diarrhea and fever     Past Medical History:  Diagnosis Date  . Anemia    CHILDHOOD  . Bilateral ovarian cysts   . Dislocation of right shoulder joint   . Scaphoid fracture, wrist, closed    right    Patient Active Problem List   Diagnosis Date Noted  . Endometriosis 01/10/2016  . Right ovarian cyst   . Cyst of right ovary   . Health care maintenance 11/26/2014  . Environmental and seasonal allergies 11/03/2014  . RLQ abdominal pain 09/18/2014  . Hypokalemia 09/18/2014  . Anemia 09/18/2014  . Dysmenorrhea 03/04/2014    Past Surgical History:  Procedure Laterality Date  . LAPAROSCOPIC OVARIAN CYSTECTOMY Right 05/18/2015   Procedure: LAPAROSCOPIC OVARIAN CYSTECTOMY;  Surgeon: Catalina Antigua, MD;  Location: [facility];  Service: Gynecology;  Laterality: Right;  Requested 05-18-15 @ 7:30a  . LAPAROSCOPIC OVARIAN CYSTECTOMY Right 01/10/2016   Procedure: LAPAROSCOPIC RIGHT OVARIAN CYSTECTOMY;  Surgeon: Catalina Antigua, MD;  Location: [facility];  Service: Gynecology;  Laterality: Right;    OB History    Gravida Para Term Preterm AB Living   1 0 0 0 0 0   SAB TAB Ectopic Multiple Live Births   0 0 0 0         Home Medications    Prior to Admission medications   Medication Sig Start Date End Date Taking? Authorizing Provider  docusate sodium (COLACE) 100 MG capsule Take 1 capsule (100 mg total) by mouth 2 (two) times daily as needed. Patient not taking: Reported on 09/21/2016 01/10/16   Catalina Antigua, MD  ibuprofen (ADVIL,MOTRIN)  600 MG tablet Take 1 tablet (600 mg total) by mouth every 6 (six) hours as needed. Patient not taking: Reported on 09/21/2016 01/10/16   Catalina Antigua, MD  norgestimate-ethinyl estradiol (ORTHO-CYCLEN,SPRINTEC,PREVIFEM) 0.25-35 MG-MCG tablet Take 1 tablet by mouth daily. Patient not taking: Reported on 09/21/2016 02/03/16   Catalina Antigua, MD  ondansetron (ZOFRAN ODT) 4 MG disintegrating tablet Take 1 tablet (4 mg total) by mouth every 8 (eight) hours as needed for nausea or vomiting. 09/21/16   Shon Baton, MD  oxyCODONE-acetaminophen (PERCOCET/ROXICET) 5-325 MG tablet Take 1 tablet by mouth every 4 (four) hours as needed. Patient not taking: Reported on 02/03/2016 01/10/16   Catalina Antigua, MD    Family History Family History  Problem Relation Age of Onset  . Hypertension Mother   . Hypertension Maternal Grandmother   . Diabetes Maternal Grandmother   . Hyperlipidemia Maternal Grandmother     Social History Social History  Substance Use Topics  . Smoking status: Never Smoker  . Smokeless tobacco: Never Used  . Alcohol use Yes     Comment: socially     Allergies   Bee venom   Review of Systems Review of Systems  Constitutional: Positive for chills and fever.  Respiratory: Negative.   Cardiovascular: Negative.   Gastrointestinal: Positive for abdominal pain, diarrhea, nausea and vomiting.  Genitourinary: Negative.  Physical Exam Triage Vital Signs ED Triage Vitals  Enc Vitals Group     BP 09/22/16 1154 130/82     Pulse Rate 09/22/16 1154 78     Resp 09/22/16 1154 18     Temp 09/22/16 1154 98.6 F (37 C)     Temp Source 09/22/16 1154 Oral     SpO2 09/22/16 1154 100 %     Weight --      Height --      Head Circumference --      Peak Flow --      Pain Score 09/22/16 1155 10     Pain Loc --      Pain Edu? --      Excl. in GC? --    No data found.   Updated Vital Signs BP 130/82 (BP Location: Right Arm)   Pulse 78   Temp 98.6 F (37 C) (Oral)   Resp  18   LMP 09/20/2016   SpO2 100%   Visual Acuity Right Eye Distance:   Left Eye Distance:   Bilateral Distance:    Right Eye Near:   Left Eye Near:    Bilateral Near:     Physical Exam  Constitutional: She appears well-developed and well-nourished.  HENT:  Mouth/Throat: Oropharynx is clear and moist.  Neck: Normal range of motion. Neck supple.  Pulmonary/Chest: Effort normal.  Abdominal: Soft. Bowel sounds are normal. She exhibits no distension and no mass. There is tenderness. There is no rebound and no guarding.  Lymphadenopathy:    She has no cervical adenopathy.  Skin: Skin is warm and dry.  Nursing note and vitals reviewed.    UC Treatments / Results  Labs (all labs ordered are listed, but only abnormal results are displayed) Labs Reviewed - No data to display  EKG  EKG Interpretation None       Radiology No results found.  Procedures Procedures (including critical care time)  Medications Ordered in UC Medications  ondansetron (ZOFRAN) injection 4 mg (4 mg Intravenous Given 09/22/16 1201)     Initial Impression / Assessment and Plan / UC Course  I have reviewed the triage vital signs and the nursing notes.  Pertinent labs & imaging results that were available during my care of the patient were reviewed by me and considered in my medical decision making (see chart for details).     Sent for eval of persistent n/v/d. Seen 2/15 in ER.  Final Clinical Impressions(s) / UC Diagnoses   Final diagnoses:  Gastroenteritis, acute    New Prescriptions New Prescriptions   No medications on file     Linna HoffJames D Rajat Staver, MD 09/22/16 1205

## 2016-09-22 NOTE — ED Notes (Signed)
Pt. Crying out in pain. EDP made aware.

## 2016-09-22 NOTE — ED Notes (Signed)
Patient transported to Ultrasound 

## 2016-09-23 DIAGNOSIS — R109 Unspecified abdominal pain: Secondary | ICD-10-CM

## 2016-09-23 DIAGNOSIS — R638 Other symptoms and signs concerning food and fluid intake: Secondary | ICD-10-CM

## 2016-09-23 DIAGNOSIS — R112 Nausea with vomiting, unspecified: Secondary | ICD-10-CM

## 2016-09-23 DIAGNOSIS — R1013 Epigastric pain: Secondary | ICD-10-CM

## 2016-09-23 DIAGNOSIS — F1290 Cannabis use, unspecified, uncomplicated: Secondary | ICD-10-CM

## 2016-09-23 LAB — LIPID PANEL
CHOLESTEROL: 111 mg/dL (ref 0–200)
HDL: 39 mg/dL — ABNORMAL LOW (ref >40)
LDL Cholesterol: 61 mg/dL (ref 0–99)
Total CHOL/HDL Ratio: 2.8 RATIO
Triglycerides: 53 mg/dL (ref <150)
VLDL: 11 mg/dL (ref 0–40)

## 2016-09-23 LAB — CBC
HCT: 28.2 % — ABNORMAL LOW (ref 36.0–46.0)
Hemoglobin: 9.5 g/dL — ABNORMAL LOW (ref 12.0–15.0)
MCH: 31 pg (ref 26.0–34.0)
MCHC: 33.7 g/dL (ref 30.0–36.0)
MCV: 92.2 fL (ref 78.0–100.0)
Platelets: 171 10*3/uL (ref 150–400)
RBC: 3.06 MIL/uL — ABNORMAL LOW (ref 3.87–5.11)
RDW: 12.7 % (ref 11.5–15.5)
WBC: 7.9 10*3/uL (ref 4.0–10.5)

## 2016-09-23 LAB — BASIC METABOLIC PANEL
Anion gap: 7 (ref 5–15)
CALCIUM: 8.3 mg/dL — AB (ref 8.9–10.3)
CO2: 24 mmol/L (ref 22–32)
Chloride: 110 mmol/L (ref 101–111)
Creatinine, Ser: 0.76 mg/dL (ref 0.44–1.00)
GFR calc Af Amer: >60 mL/min (ref >60)
GLUCOSE: 97 mg/dL (ref 65–99)
Potassium: 3.3 mmol/L — ABNORMAL LOW (ref 3.5–5.1)
Sodium: 141 mmol/L (ref 135–145)

## 2016-09-23 LAB — LIPASE, BLOOD: LIPASE: 21 U/L (ref 11–51)

## 2016-09-23 MED ORDER — POTASSIUM CHLORIDE ER 10 MEQ PO TBCR
10.0000 meq | EXTENDED_RELEASE_TABLET | Freq: Two times a day (BID) | ORAL | 0 refills | Status: DC
Start: 2016-09-23 — End: 2017-11-10

## 2016-09-23 MED ORDER — ONDANSETRON 4 MG PO TBDP: 4.0000 mg | ORAL_TABLET | Freq: Three times a day (TID) | ORAL | 0 refills | Status: DC | PRN

## 2016-09-23 MED ORDER — FAMOTIDINE 20 MG PO TABS: 20.0000 mg | ORAL_TABLET | Freq: Two times a day (BID) | ORAL | 1 refills | Status: DC

## 2016-09-23 MED ORDER — DICYCLOMINE HCL 20 MG PO TABS: ORAL_TABLET | ORAL | 1 refills | Status: DC

## 2016-09-23 NOTE — Progress Notes (Signed)
Patient discharge teaching given, including activity, diet, follow-up appoints, and medications. Patient verbalized understanding of all discharge instructions. IV access was d/c'd. Vitals are stable. Skin is intact except as charted in most recent assessments. Pt to be escorted out by NT, to be driven home by family. 

## 2016-09-23 NOTE — Discharge Summary (Signed)
Family Medicine Teaching [facility] Discharge Summary  Patient name: Whitney Carter Medical record number: [id number] Date of birth: 06-02-1991 Age: 26 y.o. Gender: female Date of Admission: 09/22/2016  Date of Discharge: 09/23/2016 Admitting Physician: Uvaldo Rising, MD  Primary Care Provider: No PCP Per Patient (Has seen Dr. Velora Heckler) Consultants: None  Indication for Hospitalization: Intractable Nausea/Vomiting  Discharge Diagnoses/Problem List:  Active Problems:   Intractable vomiting with nausea   Abdominal pain   Marijuana use   Poor fluid intake  Disposition: Home  Discharge Condition: Improved  Discharge Exam:  Blood pressure 110/67, pulse (!) 54, temperature 98.2 F (36.8 C), temperature source Oral, resp. rate 17, height 5\' 8"  (1.727 m), weight 154 lb 5.2 oz (70 kg), last menstrual period 09/22/2016, SpO2 98 %. Physical Exam: General: Well-appearing female, in NAD, resting comfortably in bed HENT: MMM Cardiovascular: RRR, S1, S2, no m/r/g Respiratory: CTAB, speaking in complete sentences Abdomen: +BS, soft, NT, ND, mildly TTP over epigastric region Extremities: Warm, dry. Moves all spontaneously.   Brief Hospital Course:  Whitney Carter a 26 y.o.femalewho presented with intractable nausea and vomiting for 3 days. PMH is significant for marijuana use and ovarian cysts that were removed laparoscopically x 2 (2016, 2017).   Nausea, Vomiting with Abdominal Pain: Suspect viral gastroenteritis versus cyclic vomiting syndrome in setting of recent cessation of marijuana. Benign abdominal exam throughout hospitalization but mild epigastric pain. No leukocytosis or fevers. Normal bowel gas pattern on KUB. RUQ U/S obtained, as patient said symptoms worsened with greasy foods; this was negative for gallstones or gallbladder thickening. Patient improved with IVFs and was tolerating PO by time of discharge. She was given zofran, bentyl, and pepcid to take upon  discharge. Bland diet was recommended for several days after discharge.   Hypokalemia: K 2.9 on admission. Repleted with kdur.   Issues for Follow Up:  1. Ask about any continued abdominal pain 2. Repeat BMP given hypokalemia 3. Repeat CBC, consider iron studies to workup anemia  Significant Procedures: None  Significant Labs and Imaging:   Recent Labs Lab 09/20/16 1447 09/22/16 1437 09/23/16 0558  WBC 6.8 6.4 7.9  HGB 11.6* 10.8* 9.5*  HCT 34.3* 32.4* 28.2*  PLT 219 184 171    Recent Labs Lab 09/20/16 1447 09/22/16 1437 09/23/16 0558  NA 138 138 141  K 2.9* 2.9* 3.3*  CL 109 107 110  CO2 18* 21* 24  GLUCOSE 175* 116* 97  BUN 8 7 <5*  CREATININE 0.71 0.79 0.76  CALCIUM 9.4 8.3* 8.3*  ALKPHOS 76 59  --   AST 31 31  --   ALT 26 32  --   ALBUMIN 4.1 3.5  --    Hgb A1c 5.3 Upreg negative UA with small hemoglobin (menstruating), 5 ketones Lipase 82 > 21 HIV antibody negative  Dg Abd 1 View  Result Date: 09/22/2016 CLINICAL DATA:  Nausea and vomiting since Wednesday 09/20/2016, epigastric abdominal pain EXAM: ABDOMEN - 1 VIEW COMPARISON:  08/23/2014 FINDINGS: Normal bowel gas pattern. No bowel dilatation or bowel wall thickening. Osseous structures unremarkable. Single LEFT pelvic phleboliths stable. No urinary tract calcification. IMPRESSION: Normal exam. Electronically Signed   By: Ulyses Southward M.D.   On: 09/22/2016 18:31   US Abdomen Limited Ruq  Result Date: 09/22/2016 CLINICAL DATA:  Abdominal pain EXAM: US ABDOMEN LIMITED - RIGHT UPPER QUADRANT COMPARISON:  None. FINDINGS: Gallbladder: No gallstones or gallbladder wall thickening. No pericholecystic fluid. The sonographer reports no sonographic Murphy's sign. Common  bile duct: Diameter: 3 mm Liver: No focal lesion identified. Within normal limits in parenchymal echogenicity. IMPRESSION: Normal exam. Electronically Signed   By: Kennith CenterEric  Mansell M.D.   On: 09/22/2016 20:47    Results/Tests Pending at Time of  Discharge: None  Discharge Medications:  Allergies as of 09/23/2016      Reactions   Bee Venom Swelling      Medication List    STOP taking these medications   naproxen sodium 220 MG tablet Commonly known as:  ANAPROX   oxyCODONE-acetaminophen 5-325 MG tablet Commonly known as:  PERCOCET/ROXICET     TAKE these medications   dicyclomine 20 MG tablet Commonly known as:  BENTYL Take 1 tablet before meals or at bedtime as needed.   docusate sodium 100 MG capsule Commonly known as:  COLACE Take 1 capsule (100 mg total) by mouth 2 (two) times daily as needed.   famotidine 20 MG tablet Commonly known as:  PEPCID Take 1 tablet (20 mg total) by mouth 2 (two) times daily.   ibuprofen 600 MG tablet Commonly known as:  ADVIL,MOTRIN Take 1 tablet (600 mg total) by mouth every 6 (six) hours as needed.   norgestimate-ethinyl estradiol 0.25-35 MG-MCG tablet Commonly known as:  ORTHO-CYCLEN,SPRINTEC,PREVIFEM Take 1 tablet by mouth daily.   ondansetron 4 MG disintegrating tablet Commonly known as:  ZOFRAN ODT Take 1 tablet (4 mg total) by mouth every 8 (eight) hours as needed for nausea or vomiting.   potassium chloride 10 MEQ tablet Commonly known as:  K-DUR Take 1 tablet (10 mEq total) by mouth 2 (two) times daily. Take for 4 days.       Discharge Instructions: Please refer to Patient Instructions section of EMR for full details.  Patient was counseled important signs and symptoms that should prompt return to medical care, changes in medications, dietary instructions, activity restrictions, and follow up appointments.   Follow-Up Appointments: Follow-up Information    Schedule an appointment as soon as possible for a visit with No PCP Per Patient.   Specialty:  [facility]       Haney,Alyssa, MD. Go on 09/28/2016.   Specialty:  Family Medicine Why:  4:00 pm appointment for hospital f/u. If cannot make this appointment time, please call to cancel/reschedule.  Contact  information: [address] [place] [address] [phone]           Casey BurkittHillary Moen Victory Dresden, MD 09/23/2016, 11:39 AM PGY-2, [facility]

## 2016-09-23 NOTE — Progress Notes (Signed)
Family Medicine Teaching Service Daily Progress Note Intern Pager: [phone]  Patient name: Whitney AngerJazmine S Trias Medical record number: [id number] Date of birth: 1990/09/18 Age: 26 y.o. Gender: female  Primary Care Provider: No PCP Per Patient Consultants: None Code Status: Full  Pt Overview and Major Events to Date:  2/16 - Admitted for intractable n/v  Assessment and Plan: Whitney Carter is a 26 y.o. female presenting with intractable nausea and vomiting for past 3 days. PMH is significant for marijuana use and ovarian cysts that have been removed laparoscopically x 2 (2016, 2017).   Nausea and Vomiting: Improved. Suspect viral gastroenteritis versus cyclic vomiting syndrome. Benign abdominal exam. No leukocytosis. No evidence of gallbladder stones, inflammation on RUQ U/S. Normal bowel gas pattern on KUB.  - Stop IVFs - Pepcid, bentyl  - Phenergan PO and IV 12.5 mg q6h prn but did not require - Tylenol 650 mg q6h prn for mild/moderate pain; toradol 15 mg IV q8h prn for 2 doses for moderate/severe pain - Counseled patient on bland diet as recovers - Repeat lipase 21 - lipid panel negative for hypertriglyceridemia  - ADAT  Hypokalemia: Improving. K 2.9 on admission > 3.3. Suspect 2/2 GI losses.   Hyperglycemia: 175 on 2/14, 116 on admission. May be elevated in setting of stress of likely viral illness. Family history of diabetes. No anion gap.  - hemoglobin A1c pending   Anemia: 10.8 on admission > 9.5. BL ~ 11. Normal MVC. History of iron deficiency, per patient. Not on supplementation - Consider work-up outpatient - Would obtain gastric occult blood testing if has further dark vomitus  Marijuana Use: Patient no longer intends to use.  - Counsel on cyclic vomiting syndrome  FEN/GI: ADAT, pepcid PO Prophylaxis: Lovenox  Disposition: Plan to discharge later today, as having improvement in symptoms  Subjective:  Patient reports she was able to eat soup last night and had  a couple sprites. No further episodes of nausea/vomiting. Still with some epigastric pain, however.   Objective: Temp:  [97.9 F (36.6 C)-98.6 F (37 C)] 98.2 F (36.8 C) (02/17 0558) Pulse Rate:  [48-78] 54 (02/17 0558) Resp:  [16-22] 17 (02/17 0558) BP: (110-133)/(66-92) 110/67 (02/17 0558) SpO2:  [96 %-100 %] 98 % (02/17 0558) Weight:  [141 lb (64 kg)-154 lb 5.2 oz (70 kg)] 154 lb 5.2 oz (70 kg) (02/16 2134) Physical Exam: General: Well-appearing female, in NAD, resting comfortably in bed HENT: MMM Cardiovascular: RRR, S1, S2, no m/r/g Respiratory: CTAB, speaking in complete sentences Abdomen: +BS, soft, NT, ND, mildly TTP over epigastric region Extremities: Warm, dry. Moves all spontaneously.   Laboratory:  Recent Labs Lab 09/20/16 1447 09/22/16 1437 09/23/16 0558  WBC 6.8 6.4 7.9  HGB 11.6* 10.8* 9.5*  HCT 34.3* 32.4* 28.2*  PLT 219 184 171    Recent Labs Lab 09/20/16 1447 09/22/16 1437 09/23/16 0558  NA 138 138 141  K 2.9* 2.9* 3.3*  CL 109 107 110  CO2 18* 21* 24  BUN 8 7 <5*  CREATININE 0.71 0.79 0.76  CALCIUM 9.4 8.3* 8.3*  PROT 7.5 6.3*  --   BILITOT 0.7 0.8  --   ALKPHOS 76 59  --   ALT 26 32  --   AST 31 31  --   GLUCOSE 175* 116* 97    Imaging/Diagnostic Tests: Dg Abd 1 View  Result Date: 09/22/2016 CLINICAL DATA:  Nausea and vomiting since Wednesday 09/20/2016, epigastric abdominal pain EXAM: ABDOMEN - 1 VIEW COMPARISON:  08/23/2014 FINDINGS:  Normal bowel gas pattern. No bowel dilatation or bowel wall thickening. Osseous structures unremarkable. Single LEFT pelvic phleboliths stable. No urinary tract calcification. IMPRESSION: Normal exam. Electronically Signed   By: Ulyses Southward M.D.   On: 09/22/2016 18:31   US Abdomen Limited Ruq  Result Date: 09/22/2016 CLINICAL DATA:  Abdominal pain EXAM: US ABDOMEN LIMITED - RIGHT UPPER QUADRANT COMPARISON:  None. FINDINGS: Gallbladder: No gallstones or gallbladder wall thickening. No pericholecystic  fluid. The sonographer reports no sonographic Murphy's sign. Common bile duct: Diameter: 3 mm Liver: No focal lesion identified. Within normal limits in parenchymal echogenicity. IMPRESSION: Normal exam. Electronically Signed   By: Kennith Center M.D.   On: 09/22/2016 20:47   Hillary Percell Boston, MD 09/23/2016, 10:06 AM PGY-2, [place] [facility] FPTS Intern pager: [phone], text pages welcome

## 2016-09-23 NOTE — Progress Notes (Signed)
Pt arrived to [address] alert and oriented x 4, VS stable, denied pain at this time, no signs of acute distress.  Pt oriented to room and unit notified about belongings policy, and instructed to use call light for assistance.  Call light with in reach, family at the bedside.

## 2016-09-24 LAB — HEMOGLOBIN A1C
HEMOGLOBIN A1C: 5.3 % (ref 4.8–5.6)
Mean Plasma Glucose: 105 mg/dL

## 2016-09-24 LAB — HIV ANTIBODY (ROUTINE TESTING W REFLEX): HIV SCREEN 4TH GENERATION: NONREACTIVE

## 2016-09-28 ENCOUNTER — Encounter: Payer: Self-pay | Admitting: Student

## 2016-09-28 ENCOUNTER — Ambulatory Visit (INDEPENDENT_AMBULATORY_CARE_PROVIDER_SITE_OTHER): Payer: Self-pay | Admitting: Student

## 2016-09-28 VITALS — BP 100/60 | HR 94 | Temp 98.3°F | Wt 139.0 lb

## 2016-09-28 DIAGNOSIS — F1290 Cannabis use, unspecified, uncomplicated: Secondary | ICD-10-CM

## 2016-09-28 DIAGNOSIS — R112 Nausea with vomiting, unspecified: Secondary | ICD-10-CM

## 2016-09-28 DIAGNOSIS — E876 Hypokalemia: Secondary | ICD-10-CM

## 2016-09-28 DIAGNOSIS — D649 Anemia, unspecified: Secondary | ICD-10-CM

## 2016-09-28 LAB — CBC WITH DIFFERENTIAL/PLATELET
BASOS ABS: 0 {cells}/uL (ref 0–200)
Basophils Relative: 0 %
EOS PCT: 4 %
Eosinophils Absolute: 216 cells/uL (ref 15–500)
HCT: 35.1 % (ref 35.0–45.0)
Hemoglobin: 11.8 g/dL (ref 11.7–15.5)
Lymphocytes Relative: 45 %
Lymphs Abs: 2430 cells/uL (ref 850–3900)
MCH: 30.7 pg (ref 27.0–33.0)
MCHC: 33.6 g/dL (ref 32.0–36.0)
MCV: 91.4 fL (ref 80.0–100.0)
MONOS PCT: 9 %
MPV: 9.8 fL (ref 7.5–12.5)
Monocytes Absolute: 486 cells/uL (ref 200–950)
NEUTROS ABS: 2268 {cells}/uL (ref 1500–7800)
Neutrophils Relative %: 42 %
PLATELETS: 270 10*3/uL (ref 140–400)
RBC: 3.84 MIL/uL (ref 3.80–5.10)
RDW: 13.1 % (ref 11.0–15.0)
WBC: 5.4 10*3/uL (ref 3.8–10.8)

## 2016-09-28 NOTE — Assessment & Plan Note (Addendum)
Now resolved, will monitor as needed - BMP collected today to follow hypokalemia found in clinic

## 2016-09-28 NOTE — Assessment & Plan Note (Signed)
Encouraged cessation.

## 2016-09-28 NOTE — Assessment & Plan Note (Signed)
Noted in the hospital Hgb 9.5 - will repeat hgb today - if still low will obtain anemia labs and teat as needed

## 2016-09-28 NOTE — Patient Instructions (Signed)
You will need to make a new patient appointment on your way out Please obtain new patient paper work If you have questions or concerns, call the office at [phone]

## 2016-09-28 NOTE — Progress Notes (Signed)
   Subjective:    Patient ID: Whitney Carter, female    DOB: 09/27/1990, 26 y.o.   MRN: [id number]   CC: hospital follow up  HPI: 26 y/o F presents for hospital follow up for abdominal pain and nausea/vomitting  Nausea/vomitting - now resolved - able to tolerate normal PO intake - there was some concern that her vomiting was due to exercise marijuana use and sudden cessation   Marijuana use - reports she only smokes once every other weekend  Anemia - noted to have hgb 9.5 in the hospital - denies known bleeding - denies heavy menses  Smoking status reviewed  Review of Systems  Per HPI chest pain, shortness of breath, abdominal pain, N/V/D,    Objective:  BP 100/60   Pulse 94   Temp 98.3 F (36.8 C) (Oral)   Wt 139 lb (63 kg)   LMP 09/20/2016   SpO2 99%   BMI 21.13 kg/m  Vitals and nursing note reviewed  General: NAD Cardiac: RRR Respiratory: CTAB, normal effort Abdomen: soft, nontender, nondistended, no hepatic or splenomegaly. Bowel sounds present. Skin: warm and dry, no rashes noted Neuro: alert and oriented, no focal deficits   Assessment & Plan:    Intractable vomiting with nausea Now resolved, will monitor as needed - BMP collected today to follow hypokalemia found in clinic  Marijuana use Encouraged cessation  Anemia Noted in the hospital Hgb 9.5 - will repeat hgb today - if still low will obtain anemia labs and teat as needed    Ayyub Krall A. Kennon RoundsHaney MD, MS Family Medicine Resident PGY-3 Pager [phone]

## 2016-09-29 LAB — BASIC METABOLIC PANEL WITH GFR
BUN: 8 mg/dL (ref 7–25)
CALCIUM: 9.1 mg/dL (ref 8.6–10.2)
CHLORIDE: 109 mmol/L (ref 98–110)
CO2: 23 mmol/L (ref 20–31)
CREATININE: 0.75 mg/dL (ref 0.50–1.10)
GFR, Est Non African American: >89 mL/min (ref >=60)
Glucose, Bld: 88 mg/dL (ref 65–99)
Potassium: 3.9 mmol/L (ref 3.5–5.3)
SODIUM: 141 mmol/L (ref 135–146)

## 2017-01-15 ENCOUNTER — Encounter (HOSPITAL_COMMUNITY): Payer: Self-pay | Admitting: Emergency Medicine

## 2017-01-15 DIAGNOSIS — R112 Nausea with vomiting, unspecified: Secondary | ICD-10-CM | POA: Insufficient documentation

## 2017-01-15 DIAGNOSIS — Z79899 Other long term (current) drug therapy: Secondary | ICD-10-CM | POA: Insufficient documentation

## 2017-01-15 LAB — POC URINE PREG, ED: PREG TEST UR: NEGATIVE

## 2017-01-15 MED ORDER — ONDANSETRON 4 MG PO TBDP
4.0000 mg | ORAL_TABLET | Freq: Once | ORAL | Status: AC | PRN
  Administered 2017-01-15: 4 mg via ORAL
  Filled 2017-01-15: qty 1

## 2017-01-15 NOTE — ED Triage Notes (Signed)
Pt reports having vomiting and nausea after eating or drinking since yesterday.

## 2017-01-16 ENCOUNTER — Emergency Department (HOSPITAL_COMMUNITY)
Admission: EM | Admit: 2017-01-16 | Discharge: 2017-01-16 | Disposition: A | Discharge status: Home / Self Care | Payer: Self-pay | Attending: Emergency Medicine | Admitting: Emergency Medicine

## 2017-01-16 DIAGNOSIS — R112 Nausea with vomiting, unspecified: Secondary | ICD-10-CM

## 2017-01-16 LAB — CBC
HEMATOCRIT: 37.9 % (ref 36.0–46.0)
HEMOGLOBIN: 13.3 g/dL (ref 12.0–15.0)
MCH: 31.7 pg (ref 26.0–34.0)
MCHC: 35.1 g/dL (ref 30.0–36.0)
MCV: 90.5 fL (ref 78.0–100.0)
Platelets: 270 10*3/uL (ref 150–400)
RBC: 4.19 MIL/uL (ref 3.87–5.11)
RDW: 12.4 % (ref 11.5–15.5)
WBC: 13.7 10*3/uL — ABNORMAL HIGH (ref 4.0–10.5)

## 2017-01-16 LAB — URINALYSIS, ROUTINE W REFLEX MICROSCOPIC
BILIRUBIN URINE: NEGATIVE
GLUCOSE, UA: 50 mg/dL — AB
KETONES UR: 5 mg/dL — AB
LEUKOCYTES UA: NEGATIVE
NITRITE: NEGATIVE
PROTEIN: 30 mg/dL — AB
Specific Gravity, Urine: 1.021 (ref 1.005–1.030)
pH: 6 (ref 5.0–8.0)

## 2017-01-16 LAB — COMPREHENSIVE METABOLIC PANEL
ALBUMIN: 4.7 g/dL (ref 3.5–5.0)
ALK PHOS: 82 U/L (ref 38–126)
ALT: 20 U/L (ref 14–54)
ANION GAP: 9 (ref 5–15)
AST: 21 U/L (ref 15–41)
BILIRUBIN TOTAL: 0.8 mg/dL (ref 0.3–1.2)
BUN: 7 mg/dL (ref 6–20)
CALCIUM: 9.8 mg/dL (ref 8.9–10.3)
CO2: 23 mmol/L (ref 22–32)
Chloride: 102 mmol/L (ref 101–111)
Creatinine, Ser: 0.68 mg/dL (ref 0.44–1.00)
GFR calc non Af Amer: >60 mL/min (ref >60)
Glucose, Bld: 121 mg/dL — ABNORMAL HIGH (ref 65–99)
POTASSIUM: 3.3 mmol/L — AB (ref 3.5–5.1)
SODIUM: 134 mmol/L — AB (ref 135–145)
TOTAL PROTEIN: 8.7 g/dL — AB (ref 6.5–8.1)

## 2017-01-16 LAB — POC URINE PREG, ED: Preg Test, Ur: NEGATIVE

## 2017-01-16 LAB — LIPASE, BLOOD: Lipase: 26 U/L (ref 11–51)

## 2017-01-16 MED ORDER — ONDANSETRON 4 MG PO TBDP: 4.0000 mg | ORAL_TABLET | Freq: Three times a day (TID) | ORAL | 0 refills | Status: DC | PRN

## 2017-01-16 MED ORDER — HALOPERIDOL LACTATE 5 MG/ML IJ SOLN
2.0000 mg | Freq: Once | INTRAMUSCULAR | Status: AC
  Administered 2017-01-16: 2 mg via INTRAVENOUS
  Filled 2017-01-16: qty 1

## 2017-01-16 MED ORDER — POTASSIUM CHLORIDE CRYS ER 20 MEQ PO TBCR
40.0000 meq | EXTENDED_RELEASE_TABLET | Freq: Once | ORAL | Status: AC
  Administered 2017-01-16: 40 meq via ORAL
  Filled 2017-01-16: qty 2

## 2017-01-16 MED ORDER — FAMOTIDINE IN NACL 20-0.9 MG/50ML-% IV SOLN
20.0000 mg | Freq: Once | INTRAVENOUS | Status: AC
  Administered 2017-01-16: 20 mg via INTRAVENOUS
  Filled 2017-01-16: qty 50

## 2017-01-16 NOTE — ED Notes (Signed)
Pt is alert and oriented x 4 and is verbally responsive and is c/o lower blt abdominal pain that began sat.

## 2017-01-16 NOTE — Discharge Instructions (Signed)
Drink plenty of fluids. Use Zofran for nausea. She is follow-up with your primary care doctor and your OB/GYN. Return to the ED if you are unable to keep anything down by mouth or have worsening pain.

## 2017-01-16 NOTE — ED Provider Notes (Signed)
[facility]-EMERGENCY DEPT Provider Note   CSN: [id number] Arrival date & time: 01/15/17  2238     History   Chief Complaint Chief Complaint  Patient presents with  . Vomiting    HPI Whitney Carter is a 26 y.o. female.  HPI The patient presents to the emergency Department today with complaints of emesis that had been ongoing for 2 days. States today she had epigastric pain with the emesis. Patient has history of same. She has been seen several times in the past. States that this happens every month when her period starts. Does have a history of ovarian cysts and has had ovarian cystectomy. Patient denies any lower abdominal pain. She denies any urinary symptoms, vaginal bleeding, vaginal discharge. Denies any fever, chills, diarrhea. The patient has not tried nothing for her symptoms. Emesis is nonbloody. Past Medical History:  Diagnosis Date  . Anemia    CHILDHOOD  . Bilateral ovarian cysts   . Dislocation of right shoulder joint   . Scaphoid fracture, wrist, closed    right    Patient Active Problem List   Diagnosis Date Noted  . Abdominal pain   . Marijuana use   . Poor fluid intake   . Nausea and vomiting   . Intractable vomiting with nausea 09/22/2016  . Endometriosis 01/10/2016  . Right ovarian cyst   . Cyst of right ovary   . Health care maintenance 11/26/2014  . Environmental and seasonal allergies 11/03/2014  . RLQ abdominal pain 09/18/2014  . Hypokalemia 09/18/2014  . Anemia 09/18/2014  . Dysmenorrhea 03/04/2014    Past Surgical History:  Procedure Laterality Date  . LAPAROSCOPIC OVARIAN CYSTECTOMY Right 05/18/2015   Procedure: LAPAROSCOPIC OVARIAN CYSTECTOMY;  Surgeon: Catalina Antigua, MD;  Location: [facility];  Service: Gynecology;  Laterality: Right;  Requested 05-18-15 @ 7:30a  . LAPAROSCOPIC OVARIAN CYSTECTOMY Right 01/10/2016   Procedure: LAPAROSCOPIC RIGHT OVARIAN CYSTECTOMY;  Surgeon: Catalina Antigua, MD;  Location: [facility];  Service: Gynecology;  Laterality:  Right;    OB History    Gravida Para Term Preterm AB Living   1 0 0 0 0 0   SAB TAB Ectopic Multiple Live Births   0 0 0 0         Home Medications    Prior to Admission medications   Medication Sig Start Date End Date Taking? Authorizing Provider  dicyclomine (BENTYL) 20 MG tablet Take 1 tablet before meals or at bedtime as needed. Patient not taking: Reported on 01/16/2017 09/23/16   Casey Burkitt, MD  docusate sodium (COLACE) 100 MG capsule Take 1 capsule (100 mg total) by mouth 2 (two) times daily as needed. Patient not taking: Reported on 09/21/2016 01/10/16   Constant, Peggy, MD  famotidine (PEPCID) 20 MG tablet Take 1 tablet (20 mg total) by mouth 2 (two) times daily. Patient not taking: Reported on 01/16/2017 09/23/16   Casey Burkitt, MD  ibuprofen (ADVIL,MOTRIN) 600 MG tablet Take 1 tablet (600 mg total) by mouth every 6 (six) hours as needed. Patient not taking: Reported on 09/21/2016 01/10/16   Constant, Peggy, MD  norgestimate-ethinyl estradiol (ORTHO-CYCLEN,SPRINTEC,PREVIFEM) 0.25-35 MG-MCG tablet Take 1 tablet by mouth daily. Patient not taking: Reported on 09/21/2016 02/03/16   Constant, Peggy, MD  ondansetron (ZOFRAN-ODT) 4 MG disintegrating tablet Take 1 tablet (4 mg total) by mouth every 8 (eight) hours as needed for nausea. 01/16/17   Demetrios Loll T, PA-C  potassium chloride (K-DUR) 10 MEQ tablet Take 1 tablet (10 mEq total) by  mouth 2 (two) times daily. Take for 4 days. Patient not taking: Reported on 01/16/2017 09/23/16 09/27/16  Erasmo Downer, MD    Family History Family History  Problem Relation Age of Onset  . Hypertension Mother   . Hypertension Maternal Grandmother   . Diabetes Maternal Grandmother   . Hyperlipidemia Maternal Grandmother     Social History Social History  Substance Use Topics  . Smoking status: Never Smoker  . Smokeless tobacco: Never Used  . Alcohol use Yes     Comment: socially     Allergies   Bee  venom   Review of Systems Review of Systems  Constitutional: Negative for chills and fever.  HENT: Negative for congestion.   Respiratory: Negative for cough and shortness of breath.   Cardiovascular: Negative for chest pain.  Gastrointestinal: Positive for abdominal pain (epigastric), nausea and vomiting. Negative for blood in stool and diarrhea.  Genitourinary: Negative for dysuria, flank pain, frequency, hematuria, pelvic pain, urgency, vaginal bleeding, vaginal discharge and vaginal pain.  Neurological: Negative for dizziness, syncope, weakness, light-headedness and headaches.     Physical Exam Updated Vital Signs BP (!) 102/53 (BP Location: Right Arm)   Pulse 68   Temp 98.9 F (37.2 C) (Oral)   Resp 18   Ht 5\' 8"  (1.727 m)   Wt 54.4 kg (120 lb)   LMP 01/13/2017   SpO2 100%   BMI 18.25 kg/m   Physical Exam  Constitutional: She is oriented to person, place, and time. She appears well-developed and well-nourished. No distress.  HENT:  Head: Normocephalic and atraumatic.  Mouth/Throat: Oropharynx is clear and moist.  Mucous membranes moist.  Eyes: Conjunctivae are normal. Right eye exhibits no discharge. Left eye exhibits no discharge. No scleral icterus.  Neck: Normal range of motion. Neck supple. No thyromegaly present.  Cardiovascular: Normal rate, regular rhythm, normal heart sounds and intact distal pulses.   Pulmonary/Chest: Effort normal and breath sounds normal.  Abdominal: Soft. Bowel sounds are normal. She exhibits no distension. There is no tenderness. There is no rebound and no guarding.  Musculoskeletal: Normal range of motion.  Lymphadenopathy:    She has no cervical adenopathy.  Neurological: She is alert and oriented to person, place, and time.  Skin: Skin is warm and dry. Capillary refill takes less than 2 seconds.  Nursing note and vitals reviewed.    ED Treatments / Results  Labs (all labs ordered are listed, but only abnormal results are  displayed) Labs Reviewed  COMPREHENSIVE METABOLIC PANEL - Abnormal; Notable for the following:       Result Value   Sodium 134 (*)    Potassium 3.3 (*)    Glucose, Bld 121 (*)    Total Protein 8.7 (*)    All other components within normal limits  CBC - Abnormal; Notable for the following:    WBC 13.7 (*)    All other components within normal limits  URINALYSIS, ROUTINE W REFLEX MICROSCOPIC - Abnormal; Notable for the following:    Glucose, UA 50 (*)    Hgb urine dipstick SMALL (*)    Ketones, ur 5 (*)    Protein, ur 30 (*)    Bacteria, UA RARE (*)    Squamous Epithelial / LPF 0-5 (*)    All other components within normal limits  URINE CULTURE  LIPASE, BLOOD  POC URINE PREG, ED  POC URINE PREG, ED    EKG  EKG Interpretation  Date/Time:  Tuesday January 16 2017 05:34:48 EDT  Ventricular Rate:  56 PR Interval:    QRS Duration: 88 QT Interval:  426 QTC Calculation: 412 R Axis:   86 Text Interpretation:  Sinus rhythm Borderline short PR interval Rate is slower Confirmed by Paula LibraMolpus, John ([id number]) on 01/16/2017 5:44:59 AM       Radiology No results found.  Procedures Procedures (including critical care time)  Medications Ordered in ED Medications  ondansetron (ZOFRAN-ODT) disintegrating tablet 4 mg (4 mg Oral Given 01/15/17 2342)  potassium chloride SA (K-DUR,KLOR-CON) CR tablet 40 mEq (40 mEq Oral Given 01/16/17 0458)  haloperidol lactate (HALDOL) injection 2 mg (2 mg Intravenous Given 01/16/17 0524)  famotidine (PEPCID) IVPB 20 mg premix (0 mg Intravenous Stopped 01/16/17 0631)     Initial Impression / Assessment and Plan / ED Course  I have reviewed the triage vital signs and the nursing notes.  Pertinent labs & imaging results that were available during my care of the patient were reviewed by me and considered in my medical decision making (see chart for details).     Patient presents to the emergency department today with complaints of nausea and emesis. She also  reports episode of epigastric abdominal pain after emesis today. Denies any blood in her emesis. Patient has history of same. States this is been ongoing for the past several months and happens right around the time of her period. Patient does endorse frequent marijuana use. Has been seen several times in the past for same. Vital signs are stable in ED. Abdomen is benign. States that her symptoms have resolved. She denies any lower abdominal or pelvic pain. Mild nonspecific leukocytosis of 13,000. Mild hypokalemia of 3.3 that was replaced orally. UA with small amount of hemoglobin, WBCs, squamous epithelium, rare bacteria. Patient denies any urinary symptoms. Will not treat at this time have culture urine.  The patient stated that her symptoms resolved and she was ready for discharge. By mouth challenge was initiated. She had an episode of emesis following the by mouth challenge. EKG was obtained that showed normal QTC. Haldol was given. Patient felt much improved. Repeat by mouth challenge was successful. She felt stable for discharge. Repeat abdominal exam is benign. Differential diagnosis the patient's symptoms include gastric enteritis versus cyclical vomiting due to marijuana use. Patient has been seen for same in the past. Patient also notes that symptoms usually come on around the time of her period. This may be due to menstrual cramps and pain. Encouraged her to follow with OB/GYN. Patient does not want pelvic exam or us at this time.   Pt is hemodynamically stable, in NAD, & able to ambulate in the ED. Pain has been managed & has no complaints prior to dc. Pt is comfortable with above plan and is stable for discharge at this time. All questions were answered prior to disposition. Strict return precautions for f/u to the ED were discussed.   Fi .nal Clinical Impressions(s) / ED Diagnoses   Final diagnoses:  Nausea and vomiting, intractability of vomiting not specified, unspecified vomiting type     New Prescriptions New Prescriptions   ONDANSETRON (ZOFRAN-ODT) 4 MG DISINTEGRATING TABLET    Take 1 tablet (4 mg total) by mouth every 8 (eight) hours as needed for nausea.     Rise MuLeaphart, Sharae Zappulla T, PA-C 01/16/17 0729    Paula LibraMolpus, John, MD 01/16/17 [phone]

## 2017-01-17 LAB — URINE CULTURE: Culture: NO GROWTH

## 2017-05-29 ENCOUNTER — Encounter: Payer: Self-pay | Admitting: Internal Medicine

## 2017-07-20 ENCOUNTER — Other Ambulatory Visit: Payer: Self-pay

## 2017-07-20 ENCOUNTER — Encounter (HOSPITAL_COMMUNITY): Payer: Self-pay | Admitting: Emergency Medicine

## 2017-07-20 ENCOUNTER — Emergency Department (HOSPITAL_COMMUNITY)
Admission: EM | Admit: 2017-07-20 | Discharge: 2017-07-20 | Disposition: A | Discharge status: Home / Self Care | Payer: BLUE CROSS/BLUE SHIELD | Attending: Emergency Medicine | Admitting: Emergency Medicine

## 2017-07-20 ENCOUNTER — Emergency Department (HOSPITAL_COMMUNITY): Payer: BLUE CROSS/BLUE SHIELD

## 2017-07-20 DIAGNOSIS — F1290 Cannabis use, unspecified, uncomplicated: Secondary | ICD-10-CM | POA: Insufficient documentation

## 2017-07-20 DIAGNOSIS — Z79899 Other long term (current) drug therapy: Secondary | ICD-10-CM | POA: Insufficient documentation

## 2017-07-20 DIAGNOSIS — M25511 Pain in right shoulder: Secondary | ICD-10-CM | POA: Insufficient documentation

## 2017-07-20 MED ORDER — NAPROXEN 250 MG PO TABS
250.0000 mg | ORAL_TABLET | Freq: Once | ORAL | Status: AC
  Administered 2017-07-20: 250 mg via ORAL
  Filled 2017-07-20: qty 1

## 2017-07-20 MED ORDER — CYCLOBENZAPRINE HCL 10 MG PO TABS: 10.0000 mg | ORAL_TABLET | Freq: Three times a day (TID) | ORAL | 0 refills | Status: DC | PRN

## 2017-07-20 MED ORDER — NAPROXEN 375 MG PO TABS: 375.0000 mg | ORAL_TABLET | Freq: Two times a day (BID) | ORAL | 0 refills | Status: DC
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
## 2017-07-20 NOTE — ED Notes (Signed)
ED Provider at bedside. 

## 2017-07-20 NOTE — Discharge Instructions (Signed)
Your x-ray showed no signs of fracture.  This is likely musculoskeletal pain.  Would encourage warm compresses.  Also encouraged warm soaks and Epsom salt. Please take the Naproxen as prescribed for pain. Do not take any additional NSAIDs including Motrin, Aleve, Ibuprofen, Advil.  Have been given your first dose in the ED. Please the the Flexeril for muscle relaxation. This medication will make you drowsy so avoid situation that could place you in danger.  Follow-up with primary care doctor if symptoms persist.  Return to the ED with any worsening symptoms.

## 2017-07-20 NOTE — ED Notes (Signed)
Pt departed in NAD, refused use of wheelchair.  

## 2017-11-10 ENCOUNTER — Emergency Department (HOSPITAL_COMMUNITY): Payer: BLUE CROSS/BLUE SHIELD

## 2017-11-10 ENCOUNTER — Encounter (HOSPITAL_COMMUNITY): Payer: Self-pay

## 2017-11-10 ENCOUNTER — Emergency Department (HOSPITAL_COMMUNITY)
Admission: EM | Admit: 2017-11-10 | Discharge: 2017-11-10 | Disposition: A | Discharge status: Home / Self Care | Payer: BLUE CROSS/BLUE SHIELD | Attending: Emergency Medicine | Admitting: Emergency Medicine

## 2017-11-10 DIAGNOSIS — S0990XA Unspecified injury of head, initial encounter: Secondary | ICD-10-CM | POA: Diagnosis present

## 2017-11-10 DIAGNOSIS — M25562 Pain in left knee: Secondary | ICD-10-CM | POA: Insufficient documentation

## 2017-11-10 DIAGNOSIS — Y999 Unspecified external cause status: Secondary | ICD-10-CM | POA: Insufficient documentation

## 2017-11-10 DIAGNOSIS — Y9389 Activity, other specified: Secondary | ICD-10-CM | POA: Insufficient documentation

## 2017-11-10 DIAGNOSIS — Z23 Encounter for immunization: Secondary | ICD-10-CM | POA: Diagnosis not present

## 2017-11-10 DIAGNOSIS — Z79899 Other long term (current) drug therapy: Secondary | ICD-10-CM | POA: Insufficient documentation

## 2017-11-10 DIAGNOSIS — Y92481 Parking lot as the place of occurrence of the external cause: Secondary | ICD-10-CM | POA: Insufficient documentation

## 2017-11-10 DIAGNOSIS — M25511 Pain in right shoulder: Secondary | ICD-10-CM | POA: Diagnosis not present

## 2017-11-10 DIAGNOSIS — S0081XA Abrasion of other part of head, initial encounter: Secondary | ICD-10-CM | POA: Diagnosis not present

## 2017-11-10 LAB — I-STAT BETA HCG BLOOD, ED (MC, WL, AP ONLY): I-stat hCG, quantitative: <5 m[IU]/mL (ref <5)

## 2017-11-10 MED ORDER — IBUPROFEN 400 MG PO TABS: 400.0000 mg | ORAL_TABLET | Freq: Four times a day (QID) | ORAL | 0 refills | Status: DC | PRN

## 2017-11-10 MED ORDER — TETANUS-DIPHTH-ACELL PERTUSSIS 5-2.5-18.5 LF-MCG/0.5 IM SUSP
0.5000 mL | Freq: Once | INTRAMUSCULAR | Status: AC
  Administered 2017-11-10: 0.5 mL via INTRAMUSCULAR
  Filled 2017-11-10: qty 0.5

## 2017-11-10 MED ORDER — METHOCARBAMOL 500 MG PO TABS: 500.0000 mg | ORAL_TABLET | Freq: Three times a day (TID) | ORAL | 0 refills | Status: DC | PRN

## 2017-11-10 MED ORDER — SODIUM CHLORIDE 0.9 % IV BOLUS
500.0000 mL | Freq: Once | INTRAVENOUS | Status: AC
  Administered 2017-11-10: 500 mL via INTRAVENOUS

## 2017-11-10 MED ORDER — BACITRACIN ZINC 500 UNIT/GM EX OINT
TOPICAL_OINTMENT | Freq: Every day | CUTANEOUS | Status: DC
  Administered 2017-11-10: 1 via TOPICAL
  Filled 2017-11-10: qty 0.9

## 2017-11-10 MED ORDER — MORPHINE SULFATE (PF) 4 MG/ML IV SOLN
2.0000 mg | Freq: Once | INTRAVENOUS | Status: AC
  Administered 2017-11-10: 2 mg via INTRAVENOUS
  Filled 2017-11-10: qty 1

## 2017-11-10 MED ORDER — ONDANSETRON HCL 4 MG/2ML IJ SOLN
4.0000 mg | Freq: Once | INTRAMUSCULAR | Status: AC
  Administered 2017-11-10: 4 mg via INTRAVENOUS
  Filled 2017-11-10: qty 2

## 2017-11-10 MED ORDER — ACETAMINOPHEN 325 MG PO TABS: 650.0000 mg | ORAL_TABLET | Freq: Four times a day (QID) | ORAL | 0 refills | Status: DC | PRN

## 2017-11-10 NOTE — ED Notes (Addendum)
Patient transported to XRAY 

## 2017-11-10 NOTE — ED Notes (Signed)
All pt wounds cleaned and dressed with bacitracin.

## 2017-11-10 NOTE — Discharge Instructions (Addendum)
You may alternate taking Tylenol and Ibuprofen as needed for pain control. You may take 400-600 mg of ibuprofen every 6 hours and [phone] mg of Tylenol every 6 hours. Do not exceed 4000 mg of Tylenol daily as this can lead to liver damage. Also, make sure to take Ibuprofen with meals as it can cause an upset stomach. Do not take other NSAIDs while taking Ibuprofen such as (Aleve, Naprosyn, Aspirin, Celebrex, etc) and do not take more than the prescribed dose as this can lead to ulcers and bleeding in your GI tract. You may use warm and cold compresses to help with your symptoms.   You were given a prescription for Robaxin which is a muscle relaxer.  You should not drive, work, or operate machinery while taking this medication as it can make you very drowsy.    Please follow up with your primary doctor within the next 7-10 days for re-evaluation and further treatment of your symptoms.   Please return to the ER sooner if you have any new or worsening symptoms vomiting, dizziness, weakness in your arms or legs, changes in mental status, or signs of infection from any of your wounds.
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
## 2017-11-10 NOTE — ED Notes (Signed)
Patient transported to CT 
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
## 2018-02-11 ENCOUNTER — Encounter: Payer: Self-pay | Admitting: Family Medicine

## 2018-02-13 ENCOUNTER — Encounter: Payer: Self-pay | Admitting: Family Medicine

## 2018-04-30 ENCOUNTER — Ambulatory Visit (INDEPENDENT_AMBULATORY_CARE_PROVIDER_SITE_OTHER): Payer: BLUE CROSS/BLUE SHIELD | Admitting: Family Medicine

## 2018-04-30 ENCOUNTER — Encounter: Payer: Self-pay | Admitting: Family Medicine

## 2018-04-30 ENCOUNTER — Other Ambulatory Visit: Payer: Self-pay

## 2018-04-30 VITALS — BP 98/60 | HR 67 | Temp 98.0°F | Wt 125.0 lb

## 2018-04-30 DIAGNOSIS — L84 Corns and callosities: Secondary | ICD-10-CM

## 2018-04-30 NOTE — Patient Instructions (Signed)
Corns and Calluses Corns are small areas of thickened skin that occur on the top, sides, or tip of a toe. They contain a cone-shaped core with a point that can press on a nerve below. This causes pain. Calluses are areas of thickened skin that can occur anywhere on the body including hands, fingers, palms, soles of the feet, and heels.Calluses are usually larger than corns. What are the causes? Corns and calluses are caused by rubbing (friction) or pressure, such as from shoes that are too tight or do not fit properly. What increases the risk? Corns are more likely to develop in people who have toe deformities, such as hammer toes. Since calluses can occur with friction to any area of the skin, calluses are more likely to develop in people who:  Work with their hands.  Wear shoes that fit poorly, shoes that are too tight, or shoes that are high-heeled.  Have toes deformities.  What are the signs or symptoms? Symptoms of a corn or callus include:  A hard growth on the skin.  Pain or tenderness under the skin.  Redness and swelling.  Increased discomfort while wearing tight-fitting shoes.  How is this diagnosed? Corns and calluses may be diagnosed with a medical history and physical exam. How is this treated? Corns and calluses may be treated with:  Removing the cause of the friction or pressure. This may include: ? Changing your shoes. ? Wearing shoe inserts (orthotics) or other protective layers in your shoes, such as a corn pad. ? Wearing gloves.  Medicines to help soften skin in the hardened, thickened areas.  Reducing the size of the corn or callus by removing the dead layers of skin.  Antibiotic medicines to treat infection.  Surgery, if a toe deformity is the cause.  Follow these instructions at home:  Take medicines only as directed by your health care provider.  If you were prescribed an antibiotic, finish all of it even if you start to feel better.  Wear  shoes that fit well. Avoid wearing high-heeled shoes and shoes that are too tight or too loose.  Wear any padding, protective layers, gloves, or orthotics as directed by your health care provider.  Soak your hands or feet and then use a file or pumice stone to soften your corn or callus. Do this as directed by your health care provider.  Check your corn or callus every day for signs of infection. Watch for: ? Redness, swelling, or pain. ? Fluid, blood, or pus. Contact a health care provider if:  Your symptoms do not improve with treatment.  You have increased redness, swelling, or pain at the site of your corn or callus.  You have fluid, blood, or pus coming from your corn or callus.  You have new symptoms. This information is not intended to replace advice given to you by your health care provider. Make sure you discuss any questions you have with your health care provider. Document Released: 04/29/2004 Document Revised: 02/11/2016 Document Reviewed: 07/20/2014 Elsevier Interactive Patient Education  2018 Elsevier Inc.  

## 2018-04-30 NOTE — Progress Notes (Signed)
  Subjective:     Patient ID: Whitney Carter, female   DOB: 11-Mar-1991, 27 y.o.   MRN: [id number]  HPI Calluses: C/O hard lesion on the bottom of her feet that has been there for more than 1 yrs, now started with sharp pain about 1 week or more ago. Denies trauma or injury. 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 acetaminophen (TYLENOL) 325 MG tablet Take 2 tablets (650 mg total) by mouth every 6 (six) hours as needed. Do not take more than 4000mg  of tylenol per day (Patient not taking: Reported on 04/30/2018) 30 tablet 0  . ibuprofen (ADVIL,MOTRIN) 400 MG tablet Take 1 tablet (400 mg total) by mouth every 6 (six) hours as needed. (Patient not taking: Reported on 04/30/2018) 30 tablet 0  . methocarbamol (ROBAXIN) 500 MG tablet Take 1 tablet (500 mg total) by mouth every 8 (eight) hours as needed for muscle spasms. (Patient not taking: Reported on 04/30/2018) 6 tablet 0  . norgestimate-ethinyl estradiol (ORTHO-CYCLEN,SPRINTEC,PREVIFEM) 0.25-35 MG-MCG tablet Take 1 tablet by mouth daily. (Patient not taking: Reported on 09/21/2016) 1 Package 11   No current facility-administered medications on file prior to visit.    Past Medical History:  Diagnosis Date  . Anemia    CHILDHOOD  . Bilateral ovarian cysts   . Dislocation of right shoulder joint   . Scaphoid fracture, wrist, closed    right   Vitals:   04/30/18 0957  BP: 98/60  Pulse: 67  Temp: 98 F (36.7 C)  TempSrc: Oral  SpO2: 99%  Weight: 125 lb (56.7 kg)     Review of Systems  Respiratory: Negative.   Cardiovascular: Negative.   Musculoskeletal:       Feet lesion  Neurological: Negative.   All other systems reviewed and are negative.      Objective:   Physical Exam  Constitutional: She appears well-developed. No distress.  Cardiovascular: Normal rate, regular rhythm and normal heart sounds.  No murmur  heard. Pulmonary/Chest: Effort normal and breath sounds normal. No stridor. No respiratory distress. She has no wheezes.  Abdominal: Soft. She exhibits no distension. There is no tenderness.  Musculoskeletal: Normal range of motion. She exhibits no edema.  Hard callus, mildly tender on the ball of her right big toe and her 2nd toe. Similar but smaller callus on her left big toe ball.  Nursing note and vitals reviewed.      Assessment:     Calluses    Plan:     Wear appropriate, soft and well fitting shoe. Referral to podiatrist completed for treatment. F/U with PCP recommended for PAP. She declined flu shot today.

## 2018-05-09 ENCOUNTER — Encounter: Payer: Self-pay | Admitting: Family Medicine

## 2018-05-09 ENCOUNTER — Ambulatory Visit (INDEPENDENT_AMBULATORY_CARE_PROVIDER_SITE_OTHER): Payer: BLUE CROSS/BLUE SHIELD | Admitting: Family Medicine

## 2018-05-09 ENCOUNTER — Other Ambulatory Visit: Payer: Self-pay

## 2018-05-09 ENCOUNTER — Encounter: Payer: Self-pay | Admitting: *Deleted

## 2018-05-09 ENCOUNTER — Other Ambulatory Visit (HOSPITAL_COMMUNITY)
Admission: RE | Admit: 2018-05-09 | Discharge: 2018-05-09 | Disposition: A | Discharge status: Home / Self Care | Payer: BLUE CROSS/BLUE SHIELD | Source: Ambulatory Visit | Attending: Family Medicine | Admitting: Family Medicine

## 2018-05-09 VITALS — BP 108/64 | HR 70 | Wt 121.0 lb

## 2018-05-09 DIAGNOSIS — Z124 Encounter for screening for malignant neoplasm of cervix: Secondary | ICD-10-CM

## 2018-05-09 DIAGNOSIS — L84 Corns and callosities: Secondary | ICD-10-CM | POA: Diagnosis not present

## 2018-05-09 DIAGNOSIS — Z9189 Other specified personal risk factors, not elsewhere classified: Secondary | ICD-10-CM | POA: Insufficient documentation

## 2018-05-09 NOTE — Patient Instructions (Addendum)
Thank you for coming in to see [place] today! Please see below to review our plan for today's visit:  1.  Your Pap smear was performed today and various types of HPV (human papilloma virus) were tested. 2.  Please follow-up in 6 months for repeat Pap smear. 3.  Please follow-up with podiatry outpatient for guidance regarding calluses.  Please call the clinic at [phone] if your symptoms worsen or you have any concerns. It was our pleasure to serve you!     Dr. Peggyann Shoals [facility]

## 2018-05-09 NOTE — Assessment & Plan Note (Signed)
Patient with history of HSIL with Colposcopy and LEEP. - STI Check today - trich, GC/Chlamydia, yeast, and BV - Tested for HPV 16, 18, and 45 - Repeat Pap in 6 months (April 2020) due to history of HSIL

## 2018-05-09 NOTE — Progress Notes (Signed)
   Subjective:    Patient ID: Whitney Carter, female    DOB: 08/19/1990, 27 y.o.   MRN: [id number]   CC: well woman exam  HPI:   Flu shot - Declined PAP - no concerns today, has occasional nausea/vomiting on first day of menses, lasts 7 days with heavy flow at beginning of week   Toes - painful calluses to plantar surfaces to feet bilaterally, beneath MTP bilaterally, had since middle/high school, maintained, tried lotrimin cream. Has not tried soaking in warm bath and scrubbing with pumice stone. Patient works at [employer] and states she walks a lot.  Review of Systems  Constitutional: Negative for weight loss.  Respiratory: Negative for cough.   Cardiovascular: Negative for chest pain.  Gastrointestinal: Negative for abdominal pain, nausea and vomiting.  Musculoskeletal:       Bilateral plantar foot calluses  Neurological: Negative for sensory change.   Objective:  BP 108/64   Pulse 70   Wt 121 lb (54.9 kg)   LMP 04/23/2018   SpO2 99%   BMI 17.87 kg/m  Vitals and nursing note reviewed  Physical Exam  Constitutional: She appears well-developed and well-nourished.  Thin  HENT:  Head: Atraumatic.  Eyes: EOM are normal.  Neck: Normal range of motion.  Cardiovascular: Normal rate and regular rhythm.  Pulmonary/Chest: Effort normal.  Abdominal: Soft. Bowel sounds are normal.  Genitourinary: Vagina normal and uterus normal.  Musculoskeletal: Normal range of motion.  Bilateral plantar callus  Lymphadenopathy:    She has no cervical adenopathy.  Neurological: She is alert.  Skin: Skin is warm.  Psychiatric: She has a normal mood and affect. Her behavior is normal.   Assessment & Plan:   Encounter for Papanicolaou smear of cervix in high-risk patient with prior abnormal result Patient with history of HSIL with Colposcopy and LEEP. - STI Check today - trich, GC/Chlamydia, yeast, and BV - Tested for HPV 16, 18, and 45 - Repeat Pap in 6 months (April 2020) due to history of  HSIL  Foot callus Patient with bilateral plantar surface calluses, to 1st and 2nd  MTP joints. This is a chronic problem since middle school. - Referral to Podiatry - Encouraged soaking feet in warm soapy water and scrubbing with pumice stone after soaking for maintenance.   Return in about 6 months (around 11/08/2018) for Repeat Pap for history of HSIL.   Dr. Peggyann Shoals [facility], PGY-1

## 2018-05-09 NOTE — Assessment & Plan Note (Signed)
Patient with bilateral plantar surface calluses, to 1st and 2nd  MTP joints. This is a chronic problem since middle school. - Referral to Podiatry - Encouraged soaking feet in warm soapy water and scrubbing with pumice stone after soaking for maintenance.

## 2018-05-15 LAB — CYTOLOGY - PAP
Bacterial vaginitis: POSITIVE — AB
Candida vaginitis: NEGATIVE
Chlamydia: NEGATIVE
Diagnosis: NEGATIVE
NEISSERIA GONORRHEA: NEGATIVE
Trichomonas: NEGATIVE

## 2018-05-23 ENCOUNTER — Ambulatory Visit (INDEPENDENT_AMBULATORY_CARE_PROVIDER_SITE_OTHER): Payer: BLUE CROSS/BLUE SHIELD | Admitting: Podiatry

## 2018-05-23 ENCOUNTER — Other Ambulatory Visit: Payer: Self-pay | Admitting: Podiatry

## 2018-05-23 ENCOUNTER — Ambulatory Visit (INDEPENDENT_AMBULATORY_CARE_PROVIDER_SITE_OTHER): Payer: BLUE CROSS/BLUE SHIELD

## 2018-05-23 ENCOUNTER — Encounter: Payer: Self-pay | Admitting: Podiatry

## 2018-05-23 DIAGNOSIS — M79672 Pain in left foot: Secondary | ICD-10-CM | POA: Diagnosis not present

## 2018-05-23 DIAGNOSIS — M779 Enthesopathy, unspecified: Secondary | ICD-10-CM

## 2018-05-23 DIAGNOSIS — M2142 Flat foot [pes planus] (acquired), left foot: Principal | ICD-10-CM

## 2018-05-23 DIAGNOSIS — M2141 Flat foot [pes planus] (acquired), right foot: Secondary | ICD-10-CM | POA: Diagnosis not present

## 2018-05-23 DIAGNOSIS — M79671 Pain in right foot: Secondary | ICD-10-CM

## 2018-05-23 DIAGNOSIS — L84 Corns and callosities: Secondary | ICD-10-CM | POA: Diagnosis not present

## 2018-05-23 NOTE — Progress Notes (Signed)
   Subjective:    Patient ID: Whitney Carter, female    DOB: 03/30/1991, 27 y.o.   MRN: [id number]  HPI    Review of Systems  All other systems reviewed and are negative.      Objective:   Physical Exam        Assessment & Plan:

## 2018-05-24 NOTE — Progress Notes (Signed)
Subjective:   Patient ID: Whitney Carter, female   DOB: 27 y.o.   MRN: [id number]   HPI Patient presents stating that she has a lot of pain underneath the fifth metatarsals of both feet and she works on concrete floors and is been gradually becoming more irritating for her over time.  Patient does not smoke and likes to be active   Review of Systems  All other systems reviewed and are negative.       Objective:  Physical Exam  Constitutional: She appears well-developed and well-nourished.  Cardiovascular: Intact distal pulses.  Pulmonary/Chest: Effort normal.  Musculoskeletal: Normal range of motion.  Neurological: She is alert.  Skin: Skin is warm.  Nursing note and vitals reviewed.   Neurovascular status intact muscle strength is adequate range of motion within normal limits with patient found to have plantarflexed fifth metatarsal bilateral a narrow long foot structure with quite a bit of pain when I pressed into these areas.  There is fluid buildup around the joint surfaces and are gradually getting worse as she is had to be more active.  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  Patient will be seen back for [place] to recheck again as needed and today was scanned for orthotics to try to take pressure off and will be seen back by ped orthotist to have been dispensed  X-rays indicate that there is a moderate cavus foot structure with a narrow elongated foot

## 2018-05-29 ENCOUNTER — Other Ambulatory Visit: Payer: Self-pay | Admitting: Family Medicine

## 2018-05-29 DIAGNOSIS — B9689 Other specified bacterial agents as the cause of diseases classified elsewhere: Secondary | ICD-10-CM

## 2018-05-29 DIAGNOSIS — N760 Acute vaginitis: Secondary | ICD-10-CM

## 2018-05-29 MED ORDER — METRONIDAZOLE 500 MG PO TABS
500.0000 mg | ORAL_TABLET | Freq: Two times a day (BID) | ORAL | 0 refills | Status: AC
Start: 2018-05-29 — End: 2018-06-05

## 2018-05-29 NOTE — Progress Notes (Signed)
Growth of Gardnerella on vaginal smear/culture. Patient prescribed Flagyl 500mg  BID x 7 days.  Peggyann Shoals, DO [facility], PGY-1 05/29/2018 8:57 AM

## 2018-06-17 ENCOUNTER — Encounter: Payer: BLUE CROSS/BLUE SHIELD | Admitting: Orthotics

## 2018-12-15 ENCOUNTER — Ambulatory Visit (HOSPITAL_COMMUNITY)
Admission: EM | Admit: 2018-12-15 | Discharge: 2018-12-15 | Disposition: A | Discharge status: Home / Self Care | Payer: BLUE CROSS/BLUE SHIELD | Attending: Family Medicine | Admitting: Family Medicine

## 2018-12-15 ENCOUNTER — Other Ambulatory Visit: Payer: Self-pay

## 2018-12-15 ENCOUNTER — Encounter (HOSPITAL_COMMUNITY): Payer: Self-pay | Admitting: Emergency Medicine

## 2018-12-15 DIAGNOSIS — R1030 Lower abdominal pain, unspecified: Secondary | ICD-10-CM

## 2018-12-15 DIAGNOSIS — R112 Nausea with vomiting, unspecified: Secondary | ICD-10-CM

## 2018-12-15 MED ORDER — ONDANSETRON 4 MG PO TBDP
ORAL_TABLET | ORAL | Status: AC
  Filled 2018-12-15: qty 1

## 2018-12-15 MED ORDER — ONDANSETRON 4 MG PO TBDP
4.0000 mg | ORAL_TABLET | Freq: Once | ORAL | Status: AC
  Administered 2018-12-15: 17:00:00 4 mg via ORAL

## 2018-12-15 MED ORDER — ONDANSETRON 8 MG PO TBDP: 8.0000 mg | ORAL_TABLET | Freq: Three times a day (TID) | ORAL | 0 refills | Status: DC | PRN

## 2018-12-15 NOTE — ED Provider Notes (Signed)
[facility]    CSN: [id number] Arrival date & time: 12/15/18  1621     History   Chief Complaint Chief Complaint  Patient presents with  . Emesis    HPI LORILEE WALDIE is a 28 y.o. female.   PT reports she took "a few shot of tequila" last night and has been vomiting since she woke up at 0930.  She also has some lower abdominal soreness, perhaps from vomiting.  No epigastric pain or shortness of breath.  Patient denies lightheadedness but has not been able to keep water down all day.     Past Medical History:  Diagnosis Date  . Anemia    CHILDHOOD  . Bilateral ovarian cysts   . Dislocation of right shoulder joint   . Scaphoid fracture, wrist, closed    right    Patient Active Problem List   Diagnosis Date Noted  . Encounter for Papanicolaou smear of cervix in high-risk patient with prior abnormal result 05/09/2018  . Foot callus 05/09/2018  . Abdominal pain   . Marijuana use   . Poor fluid intake   . Nausea and vomiting   . Intractable vomiting with nausea 09/22/2016  . Endometriosis 01/10/2016  . Right ovarian cyst   . Cyst of right ovary   . Health care maintenance 11/26/2014  . Environmental and seasonal allergies 11/03/2014  . RLQ abdominal pain 09/18/2014  . Hypokalemia 09/18/2014  . Anemia 09/18/2014  . Dysmenorrhea 03/04/2014    Past Surgical History:  Procedure Laterality Date  . LAPAROSCOPIC OVARIAN CYSTECTOMY Right 05/18/2015   Procedure: LAPAROSCOPIC OVARIAN CYSTECTOMY;  Surgeon: Catalina Antigua, MD;  Location: [facility];  Service: Gynecology;  Laterality: Right;  Requested 05-18-15 @ 7:30a  . LAPAROSCOPIC OVARIAN CYSTECTOMY Right 01/10/2016   Procedure: LAPAROSCOPIC RIGHT OVARIAN CYSTECTOMY;  Surgeon: Catalina Antigua, MD;  Location: [facility];  Service: Gynecology;  Laterality: Right;    OB History    Gravida  1   Para  0   Term  0   Preterm  0   AB  0   Living  0     SAB  0   TAB  0   Ectopic  0   Multiple  0   Live  Births               Home Medications    Prior to Admission medications   Medication Sig Start Date End Date Taking? Authorizing Provider  ibuprofen (ADVIL,MOTRIN) 400 MG tablet Take 1 tablet (400 mg total) by mouth every 6 (six) hours as needed. 11/10/17   Couture, Cortni S, PA-C  ondansetron (ZOFRAN-ODT) 8 MG disintegrating tablet Take 1 tablet (8 mg total) by mouth every 8 (eight) hours as needed for nausea. 12/15/18   Elvina Sidle, MD    Family History Family History  Problem Relation Age of Onset  . Hypertension Mother   . Hypertension Maternal Grandmother   . Diabetes Maternal Grandmother   . Hyperlipidemia Maternal Grandmother     Social History Social History   Tobacco Use  . Smoking status: Never Smoker  . Smokeless tobacco: Never Used  Substance Use Topics  . Alcohol use: Yes    Comment: socially  . Drug use: No     Allergies   Bee venom   Review of Systems Review of Systems  Constitutional: Positive for fatigue.  Gastrointestinal: Positive for abdominal pain, nausea and vomiting.  All other systems reviewed and are negative.    Physical Exam  Triage Vital Signs ED Triage Vitals  Enc Vitals Group     BP 12/15/18 1650 135/83     Pulse Rate 12/15/18 1650 62     Resp 12/15/18 1650 18     Temp 12/15/18 1650 97.6 F (36.4 C)     Temp Source 12/15/18 1650 Oral     SpO2 12/15/18 1650 100 %     Weight --      Height --      Head Circumference --      Peak Flow --      Pain Score 12/15/18 1649 9     Pain Loc --      Pain Edu? --      Excl. in GC? --    No data found.  Updated Vital Signs BP 135/83   Pulse 62   Temp 97.6 F (36.4 C) (Oral)   Resp 18   LMP 12/06/2018   SpO2 100%    Physical Exam Vitals signs and nursing note reviewed.  Constitutional:      Appearance: Normal appearance. She is normal weight.  HENT:     Head: Normocephalic.     Mouth/Throat:     Pharynx: Oropharynx is clear.  Eyes:     Conjunctiva/sclera:  Conjunctivae normal.  Neck:     Musculoskeletal: Normal range of motion and neck supple.  Cardiovascular:     Rate and Rhythm: Normal rate.  Pulmonary:     Effort: Pulmonary effort is normal.     Breath sounds: Normal breath sounds.  Abdominal:     General: Bowel sounds are normal.     Palpations: There is no mass.     Tenderness: There is no abdominal tenderness.     Comments: Decreased nausea 30 minutes after Zofran.  Abdomen nontender with recheck  Musculoskeletal: Normal range of motion.  Skin:    General: Skin is warm and dry.  Neurological:     General: No focal deficit present.     Mental Status: She is alert and oriented to person, place, and time.  Psychiatric:        Mood and Affect: Mood normal.      UC Treatments / Results  Labs (all labs ordered are listed, but only abnormal results are displayed) Labs Reviewed - No data to display  EKG None  Radiology No results found.  Procedures Procedures (including critical care time)  Medications Ordered in UC Medications  ondansetron (ZOFRAN-ODT) disintegrating tablet 4 mg (4 mg Oral Given 12/15/18 1659)    Initial Impression / Assessment and Plan / UC Course  I have reviewed the triage vital signs and the nursing notes.  Pertinent labs & imaging results that were available during my care of the patient were reviewed by me and considered in my medical decision making (see chart for details).    Final Clinical Impressions(s) / UC Diagnoses   Final diagnoses:  Intractable vomiting with nausea, unspecified vomiting type  Lower abdominal pain     Discharge Instructions     Buy some mylanta to coat the stomach with antacid.  Take 1 TBSP three times a day for two days.    ED Prescriptions    Medication Sig Dispense Auth. Provider   ondansetron (ZOFRAN-ODT) 8 MG disintegrating tablet Take 1 tablet (8 mg total) by mouth every 8 (eight) hours as needed for nausea. 12 tablet Elvina Sidle, MD      Controlled Substance Prescriptions [place] Controlled Substance Registry consulted? Not Applicable   Aleczander Fandino,  Kenyon AnaKurt, MD 12/15/18 [phone]

## 2018-12-15 NOTE — Discharge Instructions (Signed)
Buy some mylanta to coat the stomach with antacid.  Take 1 TBSP three times a day for two days.

## 2018-12-15 NOTE — ED Triage Notes (Signed)
PT reports she took "a few shot of tequila" last night and has been vomiting since she woke up at 0930.

## 2020-06-07 ENCOUNTER — Other Ambulatory Visit: Payer: Self-pay

## 2020-06-07 DIAGNOSIS — Z20822 Contact with and (suspected) exposure to covid-19: Secondary | ICD-10-CM

## 2020-06-08 LAB — SARS-COV-2, NAA 2 DAY TAT

## 2020-06-08 LAB — NOVEL CORONAVIRUS, NAA: SARS-CoV-2, NAA: NOT DETECTED

## 2020-06-18 ENCOUNTER — Ambulatory Visit (INDEPENDENT_AMBULATORY_CARE_PROVIDER_SITE_OTHER): Payer: Self-pay | Admitting: Family Medicine

## 2020-06-18 ENCOUNTER — Other Ambulatory Visit: Payer: Self-pay

## 2020-06-18 ENCOUNTER — Encounter: Payer: Self-pay | Admitting: Family Medicine

## 2020-06-18 DIAGNOSIS — Z20822 Contact with and (suspected) exposure to covid-19: Secondary | ICD-10-CM | POA: Insufficient documentation

## 2020-06-18 NOTE — Assessment & Plan Note (Signed)
Patient was exposed on 10/30, has been asymptomatic and negative COVID test on 11/1. - Return to work note provided
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
## 2020-06-18 NOTE — Patient Instructions (Signed)
If you have any problems or concerns please don't hesitate to contact our office.

## 2020-06-22 ENCOUNTER — Ambulatory Visit (INDEPENDENT_AMBULATORY_CARE_PROVIDER_SITE_OTHER): Payer: Medicaid Other | Admitting: Family Medicine

## 2020-06-22 ENCOUNTER — Encounter: Payer: Self-pay | Admitting: Family Medicine

## 2020-06-22 ENCOUNTER — Other Ambulatory Visit (HOSPITAL_COMMUNITY)
Admission: RE | Admit: 2020-06-22 | Discharge: 2020-06-22 | Disposition: A | Discharge status: Home / Self Care | Payer: Medicaid Other | Source: Ambulatory Visit | Attending: Family Medicine | Admitting: Family Medicine

## 2020-06-22 ENCOUNTER — Other Ambulatory Visit: Payer: Self-pay

## 2020-06-22 VITALS — BP 118/72 | HR 98 | Wt 122.6 lb

## 2020-06-22 DIAGNOSIS — Z113 Encounter for screening for infections with a predominantly sexual mode of transmission: Secondary | ICD-10-CM | POA: Diagnosis not present

## 2020-06-22 DIAGNOSIS — Z9189 Other specified personal risk factors, not elsewhere classified: Secondary | ICD-10-CM

## 2020-06-22 DIAGNOSIS — B9689 Other specified bacterial agents as the cause of diseases classified elsewhere: Secondary | ICD-10-CM

## 2020-06-22 DIAGNOSIS — Z124 Encounter for screening for malignant neoplasm of cervix: Secondary | ICD-10-CM | POA: Diagnosis not present

## 2020-06-22 DIAGNOSIS — N760 Acute vaginitis: Secondary | ICD-10-CM | POA: Diagnosis not present

## 2020-06-22 LAB — POCT WET PREP (WET MOUNT)
Clue Cells Wet Prep Whiff POC: POSITIVE
Trichomonas Wet Prep HPF POC: ABSENT

## 2020-06-22 MED ORDER — METRONIDAZOLE 500 MG PO TABS: 500.0000 mg | ORAL_TABLET | Freq: Two times a day (BID) | ORAL | 0 refills | Status: DC

## 2020-06-22 NOTE — Assessment & Plan Note (Signed)
Pap smear performed today. 

## 2020-06-22 NOTE — Assessment & Plan Note (Signed)
Patient reporting no symptoms and no known exposure, but has been sexually active in the recent past. -HIV, RPR, GC/Ch, wet-prep

## 2020-06-22 NOTE — Progress Notes (Signed)
    SUBJECTIVE:   CHIEF COMPLAINT / HPI:   Well woman check: Patient is here for Pap smear and STD testing.  She states she has not had sexual intercourse in a little while but wanted to check to ensure she was not exposed to anything previously.  Patient is currently not having any symptoms, discharge, burning, pruritus.   PERTINENT  PMH / PSH: Reviewed  OBJECTIVE:   BP 118/72   Pulse 98   Wt 122 lb 9.6 oz (55.6 kg)   LMP 06/05/2020   SpO2 96%   BMI 18.10 kg/m   Gen: well-appearing, NAD Pelvic: normal external anatomy, cervix with notable blood near cervical os, 2 spots of discoloration on cervix, mild whitish discharge in the vaginal vault without odor or features of infection.   ASSESSMENT/PLAN:   Screen for STD (sexually transmitted disease) Patient reporting no symptoms and no known exposure, but has been sexually active in the recent past. -HIV, RPR, GC/Ch, wet-prep  Screening for malignant neoplasm of cervix Pap smear performed today.      Evelena Leyden, DO [place] [facility]

## 2020-06-22 NOTE — Assessment & Plan Note (Deleted)
Pap smear performed today. 

## 2020-06-22 NOTE — Patient Instructions (Signed)
It was so great seeing you today! We will call you with the results of the pap smear and STD testing. If you have any concerns please let us know and we will be happy to see you back in the clinic.

## 2020-06-22 NOTE — Addendum Note (Signed)
Addended by: Brandon Melnick on: 06/22/2020 05:34 PM   Modules accepted: Orders

## 2020-06-23 LAB — RPR: RPR Ser Ql: NONREACTIVE

## 2020-06-23 LAB — HEPATITIS C ANTIBODY: Hep C Virus Ab: <0.1 s/co ratio (ref 0.0–0.9)

## 2020-06-23 LAB — HIV ANTIBODY (ROUTINE TESTING W REFLEX): HIV Screen 4th Generation wRfx: NONREACTIVE

## 2020-06-24 LAB — CYTOLOGY - PAP
Chlamydia: NEGATIVE
Comment: NEGATIVE
Comment: NORMAL
Diagnosis: NEGATIVE
Neisseria Gonorrhea: NEGATIVE

## 2020-07-18 ENCOUNTER — Encounter (HOSPITAL_COMMUNITY): Payer: Self-pay | Admitting: Emergency Medicine

## 2020-07-18 ENCOUNTER — Ambulatory Visit (HOSPITAL_COMMUNITY)
Admission: EM | Admit: 2020-07-18 | Discharge: 2020-07-18 | Disposition: A | Discharge status: Home / Self Care | Payer: Self-pay | Attending: Emergency Medicine | Admitting: Emergency Medicine

## 2020-07-18 ENCOUNTER — Other Ambulatory Visit: Payer: Self-pay

## 2020-07-18 ENCOUNTER — Ambulatory Visit (INDEPENDENT_AMBULATORY_CARE_PROVIDER_SITE_OTHER): Payer: Self-pay

## 2020-07-18 DIAGNOSIS — S43001A Unspecified subluxation of right shoulder joint, initial encounter: Secondary | ICD-10-CM

## 2020-07-18 DIAGNOSIS — S43004A Unspecified dislocation of right shoulder joint, initial encounter: Secondary | ICD-10-CM

## 2020-07-18 MED ORDER — KETOROLAC TROMETHAMINE 30 MG/ML IJ SOLN
30.0000 mg | Freq: Once | INTRAMUSCULAR | Status: AC
  Administered 2020-07-18: 30 mg via INTRAMUSCULAR

## 2020-07-18 MED ORDER — ACETAMINOPHEN 325 MG PO TABS
ORAL_TABLET | ORAL | Status: AC
  Filled 2020-07-18: qty 3

## 2020-07-18 MED ORDER — ACETAMINOPHEN 325 MG PO TABS
975.0000 mg | ORAL_TABLET | Freq: Once | ORAL | Status: AC
  Administered 2020-07-18: 975 mg via ORAL

## 2020-07-18 MED ORDER — IBUPROFEN 600 MG PO TABS: 600.0000 mg | ORAL_TABLET | Freq: Four times a day (QID) | ORAL | 0 refills | Status: AC | PRN

## 2020-07-18 MED ORDER — HYDROCODONE-ACETAMINOPHEN 5-325 MG PO TABS: 1.0000 | ORAL_TABLET | Freq: Four times a day (QID) | ORAL | 0 refills | Status: AC | PRN

## 2020-07-18 MED ORDER — KETOROLAC TROMETHAMINE 30 MG/ML IJ SOLN
INTRAMUSCULAR | Status: AC
  Filled 2020-07-18: qty 1

## 2020-07-18 MED ORDER — TIZANIDINE HCL 4 MG PO TABS: 4.0000 mg | ORAL_TABLET | Freq: Three times a day (TID) | ORAL | 0 refills | Status: DC | PRN

## 2020-07-18 NOTE — ED Triage Notes (Signed)
Pt states she dislocated her right shoulder yesterday playfighting with her sister. Pt states she was able to put it back in place but has a lot of pain.

## 2020-07-18 NOTE — Discharge Instructions (Addendum)
Follow-up with orthopedics tomorrow.  You may follow-up with Dr. Rennis Chris, Aundria Rud or [place] at [place], or Dr. Susa Simmonds at [facility] orthopedics.  They are all excellent.  You do need to see them tomorrow however.  Wear the sling at all times until you are seen by orthopedics.  You may apply ice to the shoulder.  Zanaflex for muscle spasms.  Take 600 mg of ibuprofen with a Tylenol containing product 3-4 times a day as needed for pain.  Either ibuprofen with 1000 mg of Tylenol for mild to moderate pain, or ibuprofen with 1-2 Norco for severe pain.  Do not take the Tylenol and Norco at the same time as a both a Tylenol in them and too much Tylenol can hurt your liver.  Go immediately to the ER for pain not controlled with ibuprofen/Norco, if your hand turns blue, white, or for any other concerns.

## 2020-07-18 NOTE — ED Provider Notes (Signed)
HPI  SUBJECTIVE:  Whitney Carter is a right-handed 29 y.o. 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  She states that she was "play fighting" last night, and it "popped out".  She states that she was able to reduce it and has been able to move it since.  She reports that her arm feels "heavy" and she reports shoulder pain described as soreness, tightness.  No distal numbness or tingling, grip weakness, shoulder swelling, erythema, fevers.  No direct trauma to the shoulder.  She states that this is the third or fourth time that it has dislocated.  She states that she has not had any problems with it dislocating over the past 2 years.  Symptoms are worse when she is sitting around resting.  No alleviating factors.  She has not tried anything for this.  Past medical history of recurrent right shoulder dislocation, no history of diabetes, hypertension.  LMP: November 26.  Denies the possibility being pregnant.  PMD: [facility].  Orthopedics: [facility].    Past Medical History:  Diagnosis Date  . Anemia    CHILDHOOD  . Bilateral ovarian cysts   . Dislocation of right shoulder joint   . Scaphoid fracture, wrist, closed    right    Past Surgical History:  Procedure Laterality Date  . LAPAROSCOPIC OVARIAN CYSTECTOMY Right 05/18/2015   Procedure: LAPAROSCOPIC OVARIAN CYSTECTOMY;  Surgeon: Catalina Antigua, MD;  Location: [facility];  Service: Gynecology;  Laterality: Right;  Requested 05-18-15 @ 7:30a  . LAPAROSCOPIC OVARIAN CYSTECTOMY Right 01/10/2016   Procedure: LAPAROSCOPIC RIGHT OVARIAN CYSTECTOMY;  Surgeon: Catalina Antigua, MD;  Location: [facility];  Service: Gynecology;  Laterality: Right;    Family History  Problem Relation Age of Onset  . Hypertension Mother   . Hypertension Maternal Grandmother   . Diabetes Maternal Grandmother   . Hyperlipidemia Maternal Grandmother     Social History   Tobacco Use  . Smoking status: Never Smoker  . Smokeless  tobacco: Never Used  Substance Use Topics  . Alcohol use: Yes    Comment: socially  . Drug use: No    No current facility-administered medications for this encounter.  Current Outpatient Medications:  .  HYDROcodone-acetaminophen (NORCO/VICODIN) 5-325 MG tablet, Take 1-2 tablets by mouth every 6 (six) hours as needed for moderate pain or severe pain., Disp: 12 tablet, Rfl: 0 .  ibuprofen (ADVIL) 600 MG tablet, Take 1 tablet (600 mg total) by mouth every 6 (six) hours as needed., Disp: 30 tablet, Rfl: 0 .  tiZANidine (ZANAFLEX) 4 MG tablet, Take 1 tablet (4 mg total) by mouth every 8 (eight) hours as needed for muscle spasms., Disp: 30 tablet, Rfl: 0  Allergies  Allergen Reactions  . Bee Venom Swelling     ROS  As noted in HPI.   Physical Exam  BP (!) 102/59 (BP Location: Left Arm)   Pulse (!) 105   Temp 100.2 F (37.9 C) (Oral)   LMP 07/02/2020   SpO2 98%   Constitutional: Well developed, well nourished, appears uncomfortable. Eyes:  EOMI, conjunctiva normal bilaterally HENT: Normocephalic, atraumatic,mucus membranes moist Respiratory: Normal inspiratory effort Cardiovascular: Normal rate GI: nondistended skin: No rash, skin intact Musculoskeletal: Right shoulder with ROM severely limited, patient unable to perform drop test, clavicle nontender, AC joint nontender, proximal humerus nontender, shoulder joint tender.   Motor strength decreased at shoulder due to pain, Sensation grossly intact LT over deltoid region, distal NVI with hand having intact sensation and  strength in the distribution of the median, radial, and ulnar nerve.  Pain and weakness with internal rotation, pain and weakness with external rotation, negative tenderness in bicipital groove, patient unable to perform empty can test, liftoff test.  RP 2+.  Grip strength 5/5 and equal bilaterally. Neurologic: Alert & oriented x 3, no focal neuro deficits Psychiatric: Speech and behavior appropriate   ED  Course   Medications  acetaminophen (TYLENOL) tablet 975 mg (975 mg Oral Given 07/18/20 1716)  ketorolac (TORADOL) 30 MG/ML injection 30 mg (30 mg Intramuscular Given 07/18/20 1716)    Orders Placed This Encounter  Procedures  . DG Shoulder Right    Standing Status:   Standing    Number of Occurrences:   1    Order Specific Question:   Reason for Exam (SYMPTOM  OR DIAGNOSIS REQUIRED)    Answer:   s/p dislocation yesterday  eval for relocation, fx.  . Apply shoulder immobilizer/sling    Standing Status:   Standing    Number of Occurrences:   1    Order Specific Question:   Laterality    Answer:   Right    No results found for this or any previous visit (from the past 24 hour(s)). DG Shoulder Right  Result Date: 07/18/2020 CLINICAL DATA:  Status post right shoulder dislocation. EXAM: RIGHT SHOULDER - 2+ VIEW COMPARISON:  July 20, 2017. FINDINGS: No dislocation is noted, although inferior subluxation of right humeral head relative the glenoid fossa is noted. Small bone fragment is seen medial to the proximal humeral head and inferior to the glenoid fossa concerning for fracture of indeterminate age. IMPRESSION: Inferior subluxation of right humeral head relative to glenoid fossa. Small bone fragment is seen medial to the proximal humeral head and inferior to the glenoid fossa concerning for fracture of indeterminate age. Electronically Signed   By: Lupita Raider M.D.   On: 07/18/2020 16:33    ED Clinical Impression  1. Subluxation of right shoulder joint, initial encounter      ED Assessment/Plan  We will check x-ray to make sure that she has relocated her shoulder and has not sustained a [place] lesion or other fracture.  Tylenol 975 mg p.o., Toradol 30 mg IM x1.  She states that this is the third or fourth time that she has dislocated this shoulder.  Discussed with her that this may likely need surgery.    [place] Narcotic database reviewed for this patient, and feel that the  risk/benefit ratio today is favorable for proceeding with a prescription for controlled substance.  No opiate prescriptions in 2 years.  Reviewed imaging independently..  Inferior subluxation of right humeral head relative to the glenoid fossa.  Small bone fragment medial to the proximal humeral head inferior to the glenoid fossa concerning for fracture.  See radiology report for full details.  Patient with a partial subluxation of the shoulder.  She is neurovascularly intact.  She is stable to be put in a sling, home with Tylenol-containing product, ibuprofen, Zanaflex, follow-up with Dr. Rennis Chris, Aundria Rud or [place] tomorrow at [facility] since she is an established patient there, or may follow-up with Dr. Susa Simmonds at [facility] orthopedics.  To the ER for any signs of neurovascular compromise.   Discussed case with Dr. Charlann Boxer, [facility] orthopedic surgeon on-call and Dr. Susa Simmonds, [place] orthopedics on-call they both agree with plan.  She is to follow-up with either [facility] or Dr. Susa Simmonds tomorrow.  Discussed imaging, MDM, treatment plan, and plan for follow-up with patient.  Discussed sn/sx that should prompt return to the ED. patient agrees with plan.   Meds ordered this encounter  Medications  . acetaminophen (TYLENOL) tablet 975 mg  . ketorolac (TORADOL) 30 MG/ML injection 30 mg  . HYDROcodone-acetaminophen (NORCO/VICODIN) 5-325 MG tablet    Sig: Take 1-2 tablets by mouth every 6 (six) hours as needed for moderate pain or severe pain.    Dispense:  12 tablet    Refill:  0  . ibuprofen (ADVIL) 600 MG tablet    Sig: Take 1 tablet (600 mg total) by mouth every 6 (six) hours as needed.    Dispense:  30 tablet    Refill:  0  . tiZANidine (ZANAFLEX) 4 MG tablet    Sig: Take 1 tablet (4 mg total) by mouth every 8 (eight) hours as needed for muscle spasms.    Dispense:  30 tablet    Refill:  0    *This clinic note was created using Scientist, clinical (histocompatibility and immunogenetics). Therefore, there may be occasional  mistakes despite careful proofreading.   ?    Domenick Gong, MD 07/19/20 [id number]

## 2020-09-14 ENCOUNTER — Other Ambulatory Visit: Payer: Self-pay

## 2020-09-14 ENCOUNTER — Encounter (HOSPITAL_COMMUNITY): Payer: Self-pay | Admitting: Emergency Medicine

## 2020-09-14 ENCOUNTER — Ambulatory Visit (HOSPITAL_COMMUNITY)
Admission: EM | Admit: 2020-09-14 | Discharge: 2020-09-14 | Disposition: A | Discharge status: Home / Self Care | Payer: Medicaid Other

## 2020-09-14 DIAGNOSIS — H1131 Conjunctival hemorrhage, right eye: Secondary | ICD-10-CM
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
## 2020-09-28 ENCOUNTER — Encounter (HOSPITAL_COMMUNITY): Payer: Self-pay | Admitting: Emergency Medicine

## 2020-11-12 ENCOUNTER — Emergency Department (HOSPITAL_COMMUNITY)
Admission: EM | Admit: 2020-11-12 | Discharge: 2020-11-12 | Disposition: A | Discharge status: Home / Self Care | Payer: Medicaid Other | Attending: Emergency Medicine | Admitting: Emergency Medicine

## 2020-11-12 DIAGNOSIS — R1084 Generalized abdominal pain: Secondary | ICD-10-CM | POA: Insufficient documentation

## 2020-11-12 DIAGNOSIS — Z8616 Personal history of COVID-19: Secondary | ICD-10-CM | POA: Insufficient documentation

## 2020-11-12 DIAGNOSIS — R112 Nausea with vomiting, unspecified: Secondary | ICD-10-CM | POA: Insufficient documentation

## 2020-11-12 DIAGNOSIS — R1110 Vomiting, unspecified: Secondary | ICD-10-CM

## 2020-11-12 DIAGNOSIS — R109 Unspecified abdominal pain: Secondary | ICD-10-CM

## 2020-11-12 DIAGNOSIS — R1030 Lower abdominal pain, unspecified: Secondary | ICD-10-CM

## 2020-11-12 LAB — COMPREHENSIVE METABOLIC PANEL
ALT: 30 U/L (ref 0–44)
AST: 24 U/L (ref 15–41)
Albumin: 4.6 g/dL (ref 3.5–5.0)
Alkaline Phosphatase: 71 U/L (ref 38–126)
Anion gap: 13 (ref 5–15)
BUN: 7 mg/dL (ref 6–20)
CO2: 27 mmol/L (ref 22–32)
Calcium: 9.9 mg/dL (ref 8.9–10.3)
Chloride: 98 mmol/L (ref 98–111)
Creatinine, Ser: 0.94 mg/dL (ref 0.44–1.00)
GFR, Estimated: >60 mL/min (ref >60)
Glucose, Bld: 148 mg/dL — ABNORMAL HIGH (ref 70–99)
Potassium: 3.1 mmol/L — ABNORMAL LOW (ref 3.5–5.1)
Sodium: 138 mmol/L (ref 135–145)
Total Bilirubin: 0.5 mg/dL (ref 0.3–1.2)
Total Protein: 8.4 g/dL — ABNORMAL HIGH (ref 6.5–8.1)

## 2020-11-12 LAB — URINALYSIS, ROUTINE W REFLEX MICROSCOPIC
Bacteria, UA: NONE SEEN
Bilirubin Urine: NEGATIVE
Glucose, UA: NEGATIVE mg/dL
Hgb urine dipstick: NEGATIVE
Ketones, ur: NEGATIVE mg/dL
Leukocytes,Ua: NEGATIVE
Nitrite: NEGATIVE
Protein, ur: 100 mg/dL — AB
Specific Gravity, Urine: 1.024 (ref 1.005–1.030)
pH: 6 (ref 5.0–8.0)

## 2020-11-12 LAB — CBC
HCT: 43.8 % (ref 36.0–46.0)
Hemoglobin: 14.9 g/dL (ref 12.0–15.0)
MCH: 32.1 pg (ref 26.0–34.0)
MCHC: 34 g/dL (ref 30.0–36.0)
MCV: 94.4 fL (ref 80.0–100.0)
Platelets: 239 10*3/uL (ref 150–400)
RBC: 4.64 MIL/uL (ref 3.87–5.11)
RDW: 11.8 % (ref 11.5–15.5)
WBC: 12.7 10*3/uL — ABNORMAL HIGH (ref 4.0–10.5)
nRBC: 0 % (ref 0.0–0.2)

## 2020-11-12 LAB — LIPASE, BLOOD: Lipase: 33 U/L (ref 11–51)

## 2020-11-12 LAB — I-STAT BETA HCG BLOOD, ED (MC, WL, AP ONLY): I-stat hCG, quantitative: <5 m[IU]/mL (ref <5)

## 2020-11-12 MED ORDER — ONDANSETRON HCL 4 MG/2ML IJ SOLN
4.0000 mg | Freq: Once | INTRAMUSCULAR | Status: AC
  Administered 2020-11-12: 4 mg via INTRAVENOUS
  Filled 2020-11-12: qty 2

## 2020-11-12 MED ORDER — ONDANSETRON 4 MG PO TBDP: 4.0000 mg | ORAL_TABLET | Freq: Three times a day (TID) | ORAL | 0 refills | Status: AC | PRN

## 2020-11-12 MED ORDER — SODIUM CHLORIDE 0.9 % IV BOLUS
1000.0000 mL | Freq: Once | INTRAVENOUS | Status: AC
  Administered 2020-11-12: 1000 mL via INTRAVENOUS

## 2020-11-12 NOTE — ED Triage Notes (Signed)
Pt presents to the ED with generalized abdominal pain with vomiting since Wednesday. Pt states she started her menstrual cycle on Wednesday too. States there was blood in her vomit yesterday. Pt dry heaving in triage.

## 2020-11-12 NOTE — ED Notes (Signed)
Pt d/c home per MD order. Discharge summary reviewed, pt verbalizes understanding. No s/s of acute distress noted at discharge, pt voicing no complaints. Discharged home with visitor.

## 2020-11-12 NOTE — Discharge Instructions (Addendum)
You can take 600 mg of ibuprofen every 6 hours, you can take 1000 mg of Tylenol every 6 hours, you can alternate these every 3 or you can take them together.  

## 2020-11-12 NOTE — ED Provider Notes (Signed)
Medical Decision Making: Care of patient assumed from Dr. Eudelia Bunch at 0700.  Agree with history, physical exam and plan.  See their note for further details.  Briefly, The pt p/w abdominal pain nausea.  Possibly secondary to hyperemesis cannabinoid syndrome.  Laboratory studies thus far unremarkable.  Patient has been given antiemetics and is tolerating p.o.Marland Kitchen   Current plan is as follows: Assess urine for signs of infection and likely discharge with supportive care.  Vital signs remained stable patient remains comfortable.  Urinalysis shows no signs of infection.  She is safe for discharge home outpatient follow-up supportive care Zofran provided.  Return precautions discussed  I personally reviewed and interpreted all labs/imaging.      Sabino Donovan, MD 11/12/20 [phone]

## 2020-11-12 NOTE — ED Provider Notes (Signed)
[facility] EMERGENCY DEPARTMENT Provider Note  CSN: [id number] Arrival date & time: 11/12/20 0443  Chief Complaint(s) Abdominal Pain  HPI Whitney Carter is a 30 y.o. female who presents to the emergency department with 2 days of nausea and nonbloody nonbilious emesis with inability to tolerate oral intake.  She is endorsing generalized abdominal discomfort exacerbated with the emesis.  Denies any suspicious food intake.  No recent fevers or infections.  No cough or congestion.  No known sick contacts.  She endorses one episode of emesis of stool today.  Patient denies any alcohol use.  Does endorse marijuana use about 3-4 times a week.  Marijuana.  Patient is in a female female relationship and denies any sexual encounters with female.  Patient is currently on her menstrual cycle which started Wednesday.  HPI  Past Medical History Past Medical History:  Diagnosis Date  . Anemia    CHILDHOOD  . Bilateral ovarian cysts   . Dislocation of right shoulder joint   . Scaphoid fracture, wrist, closed    right   Patient Active Problem List   Diagnosis Date Noted  . Screening for malignant neoplasm of cervix 06/22/2020  . Screen for STD (sexually transmitted disease) 06/22/2020  . Bacterial vaginosis 06/22/2020  . Exposure to confirmed case of COVID-19 06/18/2020  . Encounter for Papanicolaou smear of cervix in high-risk patient with prior abnormal result 05/09/2018  . Foot callus 05/09/2018  . Abdominal pain   . Marijuana use   . Poor fluid intake   . Nausea and vomiting   . Intractable vomiting with nausea 09/22/2016  . Endometriosis 01/10/2016  . Right ovarian cyst   . Cyst of right ovary   . Health care maintenance 11/26/2014  . Environmental and seasonal allergies 11/03/2014  . RLQ abdominal pain 09/18/2014  . Hypokalemia 09/18/2014  . Anemia 09/18/2014  . Dysmenorrhea 03/04/2014   Home Medication(s) Prior to Admission medications   Medication Sig  Start Date End Date Taking? Authorizing Provider  ondansetron (ZOFRAN ODT) 4 MG disintegrating tablet Take 1 tablet (4 mg total) by mouth every 8 (eight) hours as needed for up to 10 doses for nausea or vomiting. 11/12/20  Yes Sabino Donovan, MD  HYDROcodone-acetaminophen (NORCO/VICODIN) 5-325 MG tablet Take 1-2 tablets by mouth every 6 (six) hours as needed for moderate pain or severe pain. 07/18/20   Domenick Gong, MD  ibuprofen (ADVIL) 600 MG tablet Take 1 tablet (600 mg total) by mouth every 6 (six) hours as needed. 07/18/20   Domenick Gong, MD  tiZANidine (ZANAFLEX) 4 MG tablet Take 1 tablet (4 mg total) by mouth every 8 (eight) hours as needed for muscle spasms. 07/18/20   Domenick Gong, MD                                                                                                                                    Past Surgical History  Past Surgical History:  Procedure Laterality Date  . LAPAROSCOPIC OVARIAN CYSTECTOMY Right 05/18/2015   Procedure: LAPAROSCOPIC OVARIAN CYSTECTOMY;  Surgeon: Catalina Antigua, MD;  Location: [facility];  Service: Gynecology;  Laterality: Right;  Requested 05-18-15 @ 7:30a  . LAPAROSCOPIC OVARIAN CYSTECTOMY Right 01/10/2016   Procedure: LAPAROSCOPIC RIGHT OVARIAN CYSTECTOMY;  Surgeon: Catalina Antigua, MD;  Location: [facility];  Service: Gynecology;  Laterality: Right;   Family History Family History  Problem Relation Age of Onset  . Hypertension Mother   . Hypertension Maternal Grandmother   . Diabetes Maternal Grandmother   . Hyperlipidemia Maternal Grandmother     Social History Social History   Tobacco Use  . Smoking status: Never Smoker  . Smokeless tobacco: Never Used  Substance Use Topics  . Alcohol use: Yes    Comment: socially  . Drug use: No   Allergies Bee venom  Review of Systems Review of Systems All other systems are reviewed and are negative for acute change except as noted in the HPI  Physical Exam Vital Signs  I have  reviewed the triage vital signs BP (!) 138/92   Pulse 70   Temp 99.1 F (37.3 C)   Resp 11   Ht 5\' 9"  (1.753 m)   Wt 54.4 kg   SpO2 98%   BMI 17.72 kg/m   Physical Exam Vitals reviewed.  Constitutional:      General: She is not in acute distress.    Appearance: She is well-developed. She is not diaphoretic.  HENT:     Head: Normocephalic and atraumatic.     Right Ear: External ear normal.     Left Ear: External ear normal.     Nose: Nose normal.  Eyes:     General: No scleral icterus.    Conjunctiva/sclera: Conjunctivae normal.  Neck:     Trachea: Phonation normal.  Cardiovascular:     Rate and Rhythm: Normal rate and regular rhythm.  Pulmonary:     Effort: Pulmonary effort is normal. No respiratory distress.     Breath sounds: No stridor.  Abdominal:     General: There is no distension.     Tenderness: There is abdominal tenderness (mild) in the suprapubic area. There is no guarding or rebound.  Musculoskeletal:        General: Normal range of motion.     Cervical back: Normal range of motion.  Neurological:     Mental Status: She is alert and oriented to person, place, and time.  Psychiatric:        Behavior: Behavior normal.     ED Results and Treatments Labs (all labs ordered are listed, but only abnormal results are displayed) Labs Reviewed  COMPREHENSIVE METABOLIC PANEL - Abnormal; Notable for the following components:      Result Value   Potassium 3.1 (*)    Glucose, Bld 148 (*)    Total Protein 8.4 (*)    All other components within normal limits  CBC - Abnormal; Notable for the following components:   WBC 12.7 (*)    All other components within normal limits  LIPASE, BLOOD  URINALYSIS, ROUTINE W REFLEX MICROSCOPIC  I-STAT BETA HCG BLOOD, ED ([facility], [facility], [facility] ONLY)  EKG  EKG Interpretation  Date/Time:    Ventricular Rate:    PR  Interval:    QRS Duration:   QT Interval:    QTC Calculation:   R Axis:     Text Interpretation:        Radiology No results found.  Pertinent labs & imaging results that were available during my care of the patient were reviewed by me and considered in my medical decision making (see chart for details).  Medications Ordered in ED Medications  ondansetron (ZOFRAN) injection 4 mg (4 mg Intravenous Given 11/12/20 0624)  sodium chloride 0.9 % bolus 1,000 mL (0 mLs Intravenous Stopped 11/12/20 0723)                                                                                                                                    Procedures Procedures  (including critical care time)  Medical Decision Making / ED Course I have reviewed the nursing notes for this encounter and the patient's prior records (if available in EHR or on provided paperwork).   RIAH KEHOE was evaluated in Emergency Department on 11/12/2020 for the symptoms described in the history of present illness. She was evaluated in the context of the global COVID-19 pandemic, which necessitated consideration that the patient might be at risk for infection with the SARS-CoV-2 virus that causes COVID-19. Institutional protocols and algorithms that pertain to the evaluation of patients at risk for COVID-19 are in a state of rapid change based on information released by regulatory bodies including the CDC and federal and state organizations. These policies and algorithms were followed during the patient's care in the ED.  Patient presents with nausea vomiting and abdominal discomfort. Exam with mild suprapubic discomfort with palpation. She is afebrile with stable vital signs. Labs grossly reassuring. Notable for mild leukocytosis and mild hypokalemia. No other significant electrolyte derangements or renal sufficiency.. No evidence of biliary obstruction or pancreatitis. Beta-hCG negative. Provided with Zofran for nausea and  IV fluids.  We will obtain a UA to rule out urinary tract infection.  Possible gastroenteritis versus cannabinoid hyperemesis.  Low suspicion for serious intra-abdominal, torsades infectious process requiring imaging at this time.  Patient has been able to tolerate oral intake. Will await UA and treat if needed. DC with zofran.  Patient care turned over to Dr Myrtis Ser. Patient case and results discussed in detail; please see their note for further ED managment.       Final Clinical Impression(s) / ED Diagnoses Final diagnoses:  Nausea and vomiting in adult  Lower abdominal pain      This chart was dictated using voice recognition software.  Despite best efforts to proofread,  errors can occur which can change the documentation meaning.   Nira Conn, MD 11/12/20 [phone]

## 2021-05-13 IMAGING — DX DG SHOULDER 2+V*R*
2 series · 2 of 2 positions shown · non-contrast
Comparison: July 20, 2017.

CLINICAL DATA: Status post right shoulder dislocation.

EXAM:
RIGHT SHOULDER - 2+ VIEW

[shoulder grashey]
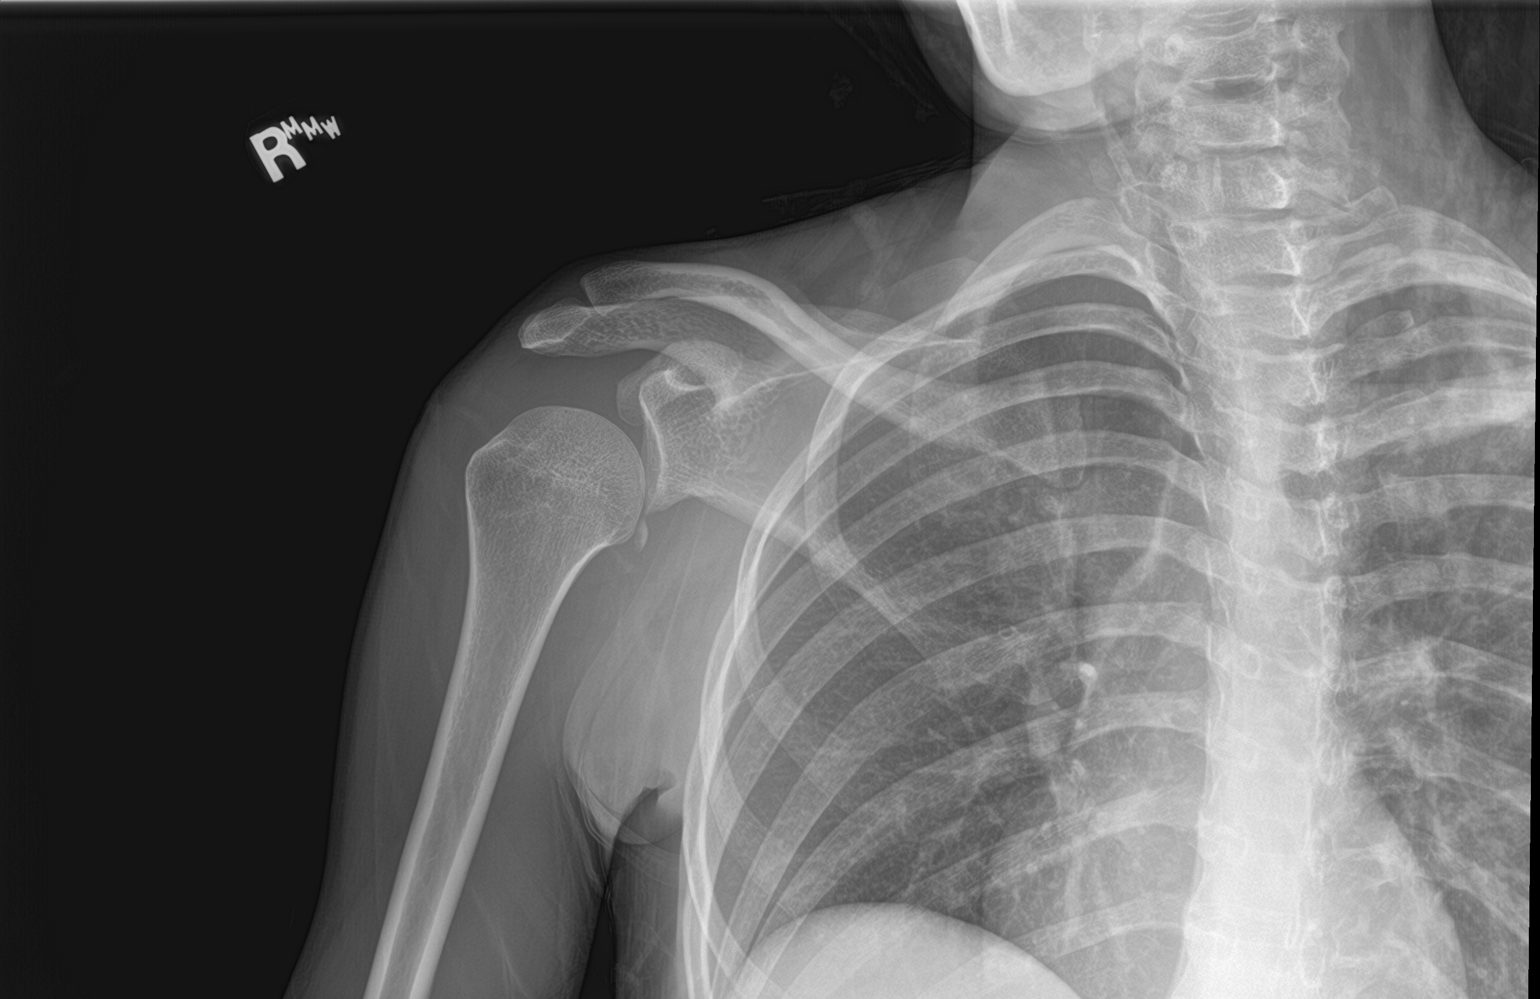

[shoulder y-view]
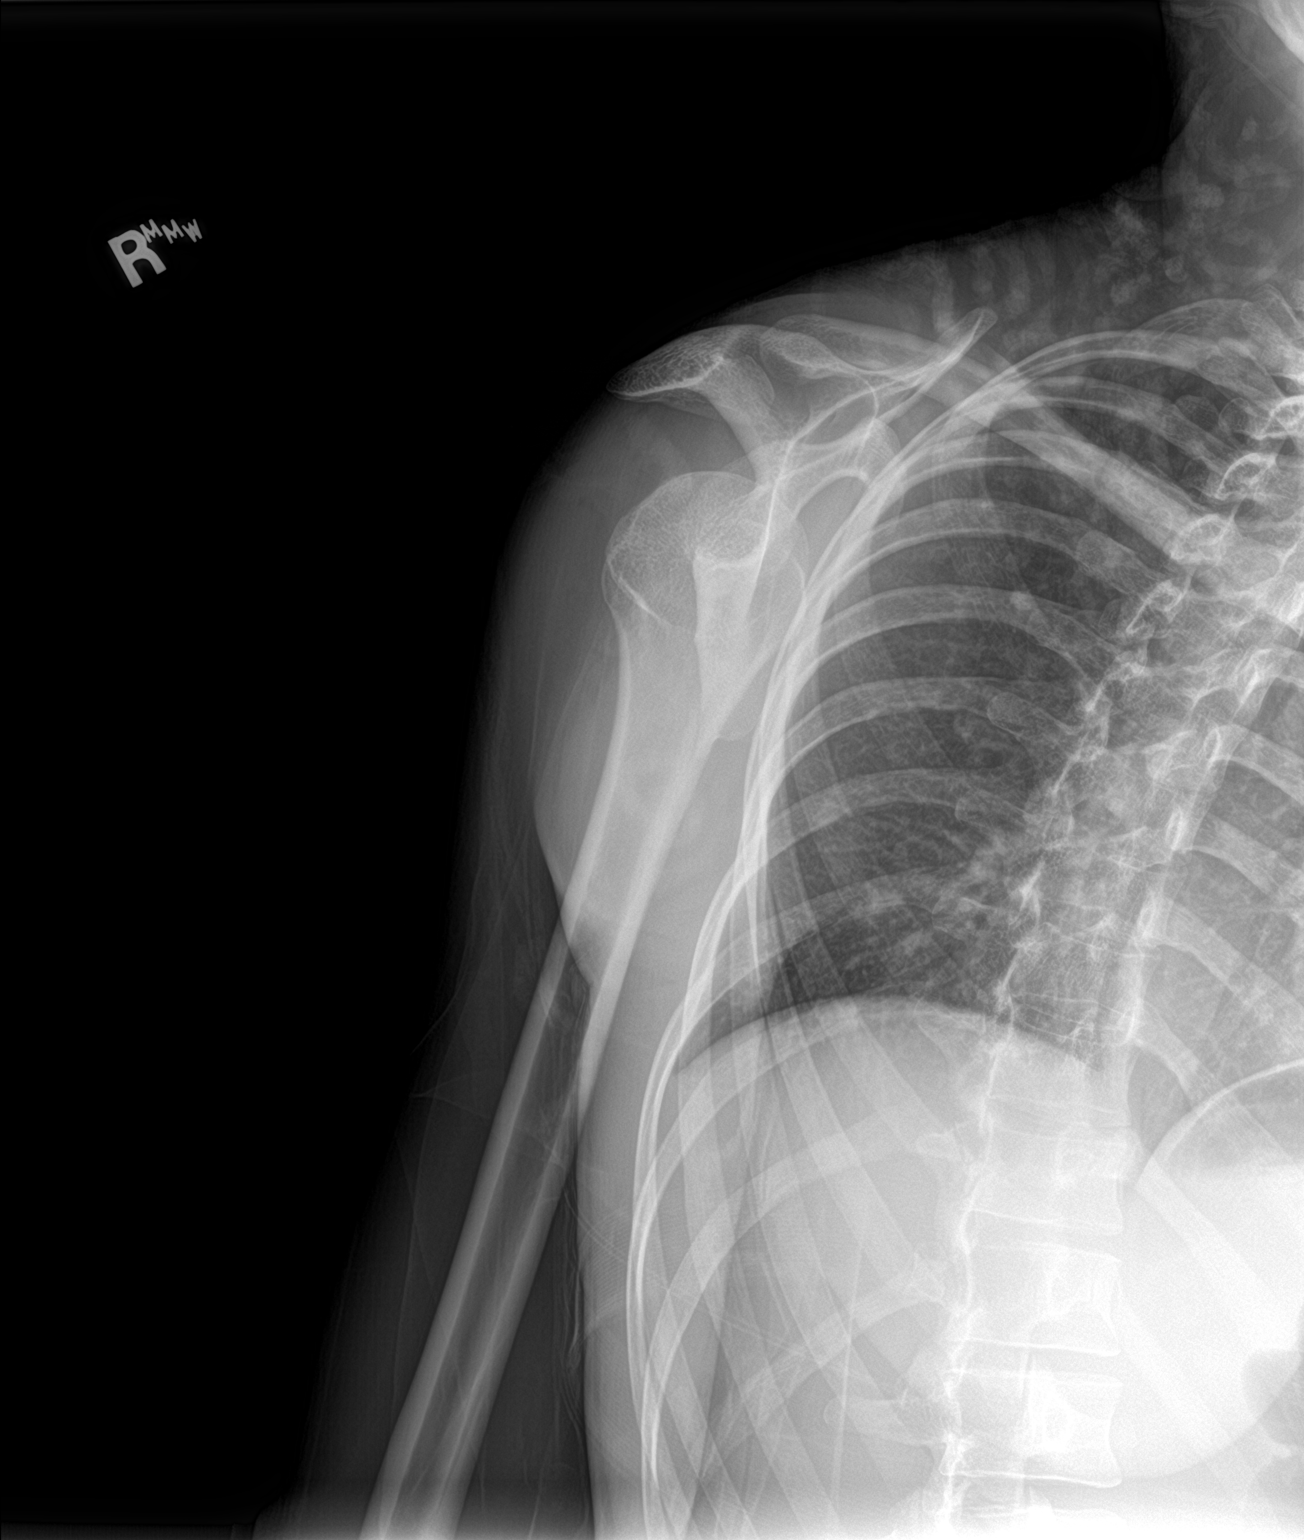

[2 of 2 positions shown; findings below may reference images not displayed]

FINDINGS: No dislocation is noted, although inferior subluxation of right
humeral head relative the glenoid fossa is noted. Small bone
fragment is seen medial to the proximal humeral head and inferior to
the glenoid fossa concerning for fracture of indeterminate age.
IMPRESSION: Inferior subluxation of right humeral head relative to glenoid
fossa. Small bone fragment is seen medial to the proximal humeral
head and inferior to the glenoid fossa concerning for fracture of
indeterminate age.

## 2022-01-10 ENCOUNTER — Encounter: Payer: Self-pay | Admitting: *Deleted

## 2023-02-26 ENCOUNTER — Ambulatory Visit: Payer: Medicaid Other | Admitting: Student

## 2023-02-26 NOTE — Progress Notes (Deleted)
  SUBJECTIVE:   CHIEF COMPLAINT / HPI:   STI check - recently became sexually active with a new partner, wants to be checked for STIs. Previously tested for ***.  - preferred gender of partner: *** - Medications tried: *** - Sexually active with *** *** partner(s) - Last sexual encounter: *** - Contraception: *** Symptoms include: {STISXs:28021}  Last pap 06/2020 overall negative   PERTINENT  PMH / PSH:      Patient Care Team: Alfredo Martinez, MD as PCP - General (Family Medicine) OBJECTIVE:  There were no vitals taken for this visit. Physical Exam   ASSESSMENT/PLAN:  There are no diagnoses linked to this encounter. No follow-ups on file. Alfredo Martinez, MD 02/26/2023, 1:22 PM PGY-***, [facility] {    This will disappear when note is signed, click to select method of visit    :1}

## 2024-04-27 ENCOUNTER — Ambulatory Visit (INDEPENDENT_AMBULATORY_CARE_PROVIDER_SITE_OTHER)

## 2024-04-27 ENCOUNTER — Ambulatory Visit

## 2024-04-27 ENCOUNTER — Ambulatory Visit (HOSPITAL_COMMUNITY)
Admission: EM | Admit: 2024-04-27 | Discharge: 2024-04-27 | Disposition: A | Discharge status: Home / Self Care | Attending: Internal Medicine | Admitting: Internal Medicine

## 2024-04-27 ENCOUNTER — Encounter (HOSPITAL_COMMUNITY): Payer: Self-pay

## 2024-04-27 DIAGNOSIS — M25511 Pain in right shoulder: Secondary | ICD-10-CM

## 2024-04-27 MED ORDER — CYCLOBENZAPRINE HCL 5 MG PO TABS: 5.0000 mg | ORAL_TABLET | Freq: Three times a day (TID) | ORAL | 0 refills | Status: AC | PRN

## 2024-04-27 MED ORDER — DEXAMETHASONE SODIUM PHOSPHATE 10 MG/ML IJ SOLN
INTRAMUSCULAR | Status: AC
  Filled 2024-04-27: qty 1

## 2024-04-27 MED ORDER — DEXAMETHASONE SODIUM PHOSPHATE 10 MG/ML IJ SOLN
10.0000 mg | Freq: Once | INTRAMUSCULAR | Status: AC
  Administered 2024-04-27: 10 mg via INTRAMUSCULAR

## 2024-04-27 MED ORDER — KETOROLAC TROMETHAMINE 30 MG/ML IJ SOLN
INTRAMUSCULAR | Status: AC
  Filled 2024-04-27: qty 1

## 2024-04-27 MED ORDER — KETOROLAC TROMETHAMINE 30 MG/ML IJ SOLN
30.0000 mg | Freq: Once | INTRAMUSCULAR | Status: AC
  Administered 2024-04-27: 30 mg via INTRAMUSCULAR

## 2024-04-27 MED ORDER — PREDNISONE 20 MG PO TABS: 40.0000 mg | ORAL_TABLET | Freq: Every day | ORAL | 0 refills | Status: AC

## 2024-04-27 NOTE — ED Provider Notes (Signed)
 [facility]    CSN: [id number] Arrival date & time: 04/27/24  1304      History   Chief Complaint Chief Complaint  Patient presents with   Shoulder Pain    HPI Whitney Carter is a 33 y.o. female.   33 year old female presents urgent care with complaints of right shoulder pain, stiffness and decreased range of motion.  This started about 4 days ago.  She woke up with the symptoms.  She denies any recent injury to the area but does have a history of dislocating her shoulder in the past.  She is right-handed but has full motion of her hand and fingers.  She is able to extend her elbow although there is some tenderness that extends up into the shoulder from this.  She denies any numbness tingling into the hand.   Shoulder Pain Associated symptoms: no back pain and no fever     Past Medical History:  Diagnosis Date   Anemia    CHILDHOOD   Bilateral ovarian cysts    Dislocation of right shoulder joint    Scaphoid fracture, wrist, closed    right    Patient Active Problem List   Diagnosis Date Noted   Screening for malignant neoplasm of cervix 06/22/2020   Screen for STD (sexually transmitted disease) 06/22/2020   Bacterial vaginosis 06/22/2020   Exposure to confirmed case of COVID-19 06/18/2020   Encounter for Papanicolaou smear of cervix in high-risk patient with prior abnormal result 05/09/2018   Foot callus 05/09/2018   Abdominal pain    Marijuana use    Poor fluid intake    Nausea and vomiting    Intractable vomiting with nausea 09/22/2016   Endometriosis 01/10/2016   Right ovarian cyst    Cyst of right ovary    Health care maintenance 11/26/2014   Environmental and seasonal allergies 11/03/2014   RLQ abdominal pain 09/18/2014   Hypokalemia 09/18/2014   Anemia 09/18/2014   Dysmenorrhea 03/04/2014    Past Surgical History:  Procedure Laterality Date   LAPAROSCOPIC OVARIAN CYSTECTOMY Right 05/18/2015   Procedure: LAPAROSCOPIC OVARIAN CYSTECTOMY;   Surgeon: Winton Felt, MD;  Location: [facility];  Service: Gynecology;  Laterality: Right;  Requested 05-18-15 @ 7:30a   LAPAROSCOPIC OVARIAN CYSTECTOMY Right 01/10/2016   Procedure: LAPAROSCOPIC RIGHT OVARIAN CYSTECTOMY;  Surgeon: Winton Felt, MD;  Location: [facility];  Service: Gynecology;  Laterality: Right;    OB History     Gravida  1   Para  0   Term  0   Preterm  0   AB  0   Living         SAB  0   IAB  0   Ectopic  0   Multiple      Live Births               Home Medications    Prior to Admission medications   Medication Sig Start Date End Date Taking? Authorizing Provider  cyclobenzaprine  (FLEXERIL ) 5 MG tablet Take 1 tablet (5 mg total) by mouth every 8 (eight) hours as needed for muscle spasms. 04/27/24  Yes Zitlaly Malson A, PA-C  predniSONE  (DELTASONE ) 20 MG tablet Take 2 tablets (40 mg total) by mouth daily with breakfast for 5 days. 04/27/24 05/02/24 Yes Bethany Cumming A, PA-C  HYDROcodone -acetaminophen  (NORCO/VICODIN) 5-325 MG tablet Take 1-2 tablets by mouth every 6 (six) hours as needed for moderate pain or severe pain. Patient not taking: Reported on 04/27/2024 07/18/20  Van Knee, MD  ibuprofen  (ADVIL ) 600 MG tablet Take 1 tablet (600 mg total) by mouth every 6 (six) hours as needed. Patient not taking: Reported on 04/27/2024 07/18/20   Mortenson, Ashley, MD  ondansetron  (ZOFRAN  ODT) 4 MG disintegrating tablet Take 1 tablet (4 mg total) by mouth every 8 (eight) hours as needed for up to 10 doses for nausea or vomiting. Patient not taking: Reported on 04/27/2024 11/12/20   Micky Camellia BROCKS, MD    Family History Family History  Problem Relation Age of Onset   Hypertension Mother    Hypertension Maternal Grandmother    Diabetes Maternal Grandmother    Hyperlipidemia Maternal Grandmother     Social History Social History   Tobacco Use   Smoking status: Never   Smokeless tobacco: Never  Vaping Use   Vaping status: Never Used  Substance  Use Topics   Alcohol use: Yes    Comment: socially   Drug use: No     Allergies   Bee venom   Review of Systems Review of Systems  Constitutional:  Negative for chills and fever.  HENT:  Negative for ear pain and sore throat.   Eyes:  Negative for pain and visual disturbance.  Respiratory:  Negative for cough and shortness of breath.   Cardiovascular:  Negative for chest pain and palpitations.  Gastrointestinal:  Negative for abdominal pain and vomiting.  Genitourinary:  Negative for dysuria and hematuria.  Musculoskeletal:  Negative for arthralgias and back pain.       Right shoulder pain, stiffness and decreased range of motion  Skin:  Negative for color change and rash.  Neurological:  Negative for seizures and syncope.  All other systems reviewed and are negative.    Physical Exam Triage Vital Signs ED Triage Vitals [04/27/24 1330]  Encounter Vitals Group     BP 103/70     Girls Systolic BP Percentile      Girls Diastolic BP Percentile      Boys Systolic BP Percentile      Boys Diastolic BP Percentile      Pulse Rate 93     Resp 14     Temp 98.5 F (36.9 C)     Temp Source Oral     SpO2 96 %     Weight      Height      Head Circumference      Peak Flow      Pain Score 8     Pain Loc      Pain Education      Exclude from Growth Chart    No data found.  Updated Vital Signs BP 103/70 (BP Location: Left Arm)   Pulse 93   Temp 98.5 F (36.9 C) (Oral)   Resp 14   LMP 04/07/2024   SpO2 96%   Visual Acuity Right Eye Distance:   Left Eye Distance:   Bilateral Distance:    Right Eye Near:   Left Eye Near:    Bilateral Near:     Physical Exam Vitals and nursing note reviewed.  Constitutional:      General: She is not in acute distress.    Appearance: She is well-developed.  HENT:     Head: Normocephalic and atraumatic.  Eyes:     Conjunctiva/sclera: Conjunctivae normal.  Cardiovascular:     Rate and Rhythm: Normal rate and regular rhythm.      Heart sounds: No murmur heard. Pulmonary:     Effort: Pulmonary  effort is normal. No respiratory distress.     Breath sounds: Normal breath sounds.  Abdominal:     Palpations: Abdomen is soft.     Tenderness: There is no abdominal tenderness.  Musculoskeletal:        General: No swelling.     Right shoulder: Tenderness present. No swelling, deformity, bony tenderness or crepitus. Decreased range of motion. Normal strength. Normal pulse.     Right hand: Normal range of motion. Normal strength. Normal capillary refill. Normal pulse.     Cervical back: Neck supple.  Skin:    General: Skin is warm and dry.     Capillary Refill: Capillary refill takes less than 2 seconds.  Neurological:     Mental Status: She is alert.  Psychiatric:        Mood and Affect: Mood normal.      UC Treatments / Results  Labs (all labs ordered are listed, but only abnormal results are displayed) Labs Reviewed - No data to display  EKG   Radiology DG Shoulder Right Result Date: 04/27/2024 CLINICAL DATA:  Right shoulder pain and decreased range of motion. No injury. EXAM: RIGHT SHOULDER - 2+ VIEW COMPARISON:  07/18/2020 FINDINGS: Subtle early degenerative changes of the glenohumeral joint. No acute fracture or dislocation. Remainder of the exam is unchanged. IMPRESSION: 1. No acute findings. 2. Subtle early degenerative changes of the glenohumeral joint. Electronically Signed   By: Toribio Agreste M.D.   On: 04/27/2024 14:17    Procedures Procedures (including critical care time)  Medications Ordered in UC Medications  ketorolac  (TORADOL ) 30 MG/ML injection 30 mg (has no administration in time range)  dexamethasone  (DECADRON ) injection 10 mg (has no administration in time range)    Initial Impression / Assessment and Plan / UC Course  I have reviewed the triage vital signs and the nursing notes.  Pertinent labs & imaging results that were available during my care of the patient were reviewed by me  and considered in my medical decision making (see chart for details).     Acute pain of right shoulder - Plan: DG Shoulder Right, DG Shoulder Right   X-ray of the right shoulder done today.  Final evaluation by the radiologist does not show any acute findings although there are some early degenerative changes.  Symptoms most likely secondary to inflammation in the joint.  We can treat with the following: Toradol  injection given today. This is a medication to help with pain. This is not a narcotic.   Decadron  injection given today. This is a steroid to help with inflammation and pain. Start 04/28/24 Prednisone  40 mg (2 tablets) once daily for 5 days. Take this in the morning.  This is a steroid to help with inflammation and pain. Flexeril  5 mg every 8 hours as needed for muscle spasms.  Use caution as this medication can cause drowsiness. May use sling for support and comfort as needed Recommend avoiding heavy activity for 2 days then increase activity as tolerated Return to urgent care or PCP if symptoms worsen or fail to resolve.     Final Clinical Impressions(s) / UC Diagnoses   Final diagnoses:  Acute pain of right shoulder     Discharge Instructions      X-ray of the right shoulder done today.  Final evaluation by the radiologist does not show any acute findings although there are some early degenerative changes.  Symptoms most likely secondary to inflammation in the joint.  We can treat with the  following: Toradol  injection given today. This is a medication to help with pain. This is not a narcotic.   Decadron  injection given today. This is a steroid to help with inflammation and pain. Start 04/28/24 Prednisone  40 mg (2 tablets) once daily for 5 days. Take this in the morning.  This is a steroid to help with inflammation and pain. Flexeril  5 mg every 8 hours as needed for muscle spasms.  Use caution as this medication can cause drowsiness. May use sling for support and comfort as  needed Recommend avoiding heavy activity for 2 days then increase activity as tolerated Return to urgent care or PCP if symptoms worsen or fail to resolve.        ED Prescriptions     Medication Sig Dispense Auth. Provider   predniSONE  (DELTASONE ) 20 MG tablet Take 2 tablets (40 mg total) by mouth daily with breakfast for 5 days. 10 tablet Teresa Norris A, PA-C   cyclobenzaprine  (FLEXERIL ) 5 MG tablet Take 1 tablet (5 mg total) by mouth every 8 (eight) hours as needed for muscle spasms. 30 tablet Teresa Norris LABOR, [place]      PDMP not reviewed this encounter.   Teresa Norris LABOR, PA-C 04/27/24 1432

## 2024-04-27 NOTE — ED Triage Notes (Signed)
 Patient reports right shoulder pain x 4 days and states the pain radiates into the right lateral neck down to her right elbow. Patient also reports a history of dislocations of the right shoulder in the past.  Patient has been taking ibuprofen  for pain.

## 2024-04-27 NOTE — Discharge Instructions (Addendum)
 X-ray of the right shoulder done today.  Final evaluation by the radiologist does not show any acute findings although there are some early degenerative changes.  Symptoms most likely secondary to inflammation in the joint.  We can treat with the following: Toradol  injection given today. This is a medication to help with pain. This is not a narcotic.   Decadron  injection given today. This is a steroid to help with inflammation and pain. Start 04/28/24 Prednisone  40 mg (2 tablets) once daily for 5 days. Take this in the morning.  This is a steroid to help with inflammation and pain. Flexeril  5 mg every 8 hours as needed for muscle spasms.  Use caution as this medication can cause drowsiness. May use sling for support and comfort as needed Recommend avoiding heavy activity for 2 days then increase activity as tolerated Return to urgent care or PCP if symptoms worsen or fail to resolve.

## 2024-06-13 ENCOUNTER — Telehealth: Admitting: Physician Assistant

## 2024-06-13 DIAGNOSIS — K047 Periapical abscess without sinus: Secondary | ICD-10-CM | POA: Diagnosis not present

## 2024-06-13 MED ORDER — AMOXICILLIN-POT CLAVULANATE 875-125 MG PO TABS: 1.0000 | ORAL_TABLET | Freq: Two times a day (BID) | ORAL | 0 refills | Status: AC

## 2024-06-13 NOTE — Progress Notes (Signed)

## 2024-07-29 ENCOUNTER — Telehealth: Admitting: Physician Assistant

## 2024-07-29 DIAGNOSIS — R112 Nausea with vomiting, unspecified: Secondary | ICD-10-CM

## 2024-07-29 NOTE — Progress Notes (Signed)

## 2024-10-08 ENCOUNTER — Encounter: Payer: Self-pay | Admitting: Obstetrics & Gynecology
